# Patient Record
Sex: Female | Born: 1940 | Race: White | Hispanic: No | Smoking: Never smoker
Health system: Southern US, Community
[De-identification: ages and names within clinical notes are randomized; demographics above are authoritative.]

## PROBLEM LIST (undated history)

## (undated) DIAGNOSIS — R519 Headache, unspecified: Secondary | ICD-10-CM

## (undated) DIAGNOSIS — C801 Malignant (primary) neoplasm, unspecified: Secondary | ICD-10-CM

## (undated) DIAGNOSIS — R51 Headache: Secondary | ICD-10-CM

## (undated) DIAGNOSIS — E785 Hyperlipidemia, unspecified: Secondary | ICD-10-CM

## (undated) DIAGNOSIS — K219 Gastro-esophageal reflux disease without esophagitis: Secondary | ICD-10-CM

## (undated) DIAGNOSIS — I10 Essential (primary) hypertension: Secondary | ICD-10-CM

## (undated) DIAGNOSIS — J189 Pneumonia, unspecified organism: Secondary | ICD-10-CM

## (undated) DIAGNOSIS — M1990 Unspecified osteoarthritis, unspecified site: Secondary | ICD-10-CM

## (undated) DIAGNOSIS — H3530 Unspecified macular degeneration: Secondary | ICD-10-CM

## (undated) DIAGNOSIS — K760 Fatty (change of) liver, not elsewhere classified: Secondary | ICD-10-CM

## (undated) HISTORY — DX: Malignant (primary) neoplasm, unspecified: C80.1

## (undated) HISTORY — PX: APPENDECTOMY: SHX54

## (undated) HISTORY — PX: CHOLECYSTECTOMY: SHX55

## (undated) HISTORY — PX: CATARACT EXTRACTION W/ INTRAOCULAR LENS  IMPLANT, BILATERAL: SHX1307

## (undated) HISTORY — PX: BREAST SURGERY: SHX581

## (undated) HISTORY — DX: Essential (primary) hypertension: I10

## (undated) HISTORY — PX: COLON SURGERY: SHX602

## (undated) HISTORY — DX: Hyperlipidemia, unspecified: E78.5

---

## 1999-02-09 ENCOUNTER — Encounter: Admission: RE | Admit: 1999-02-09 | Discharge: 1999-02-09 | Payer: Self-pay | Admitting: Internal Medicine

## 1999-02-09 ENCOUNTER — Other Ambulatory Visit: Admission: RE | Admit: 1999-02-09 | Discharge: 1999-02-09 | Payer: Self-pay | Admitting: Internal Medicine

## 1999-02-09 ENCOUNTER — Encounter: Payer: Self-pay | Admitting: Internal Medicine

## 2000-02-18 ENCOUNTER — Other Ambulatory Visit: Admission: RE | Admit: 2000-02-18 | Discharge: 2000-02-18 | Payer: Self-pay | Admitting: Internal Medicine

## 2000-03-03 ENCOUNTER — Ambulatory Visit (HOSPITAL_COMMUNITY): Admission: RE | Admit: 2000-03-03 | Discharge: 2000-03-03 | Payer: Self-pay | Admitting: Internal Medicine

## 2000-03-03 ENCOUNTER — Encounter (INDEPENDENT_AMBULATORY_CARE_PROVIDER_SITE_OTHER): Payer: Self-pay | Admitting: *Deleted

## 2000-10-24 ENCOUNTER — Encounter: Payer: Self-pay | Admitting: Internal Medicine

## 2000-10-24 ENCOUNTER — Encounter: Admission: RE | Admit: 2000-10-24 | Discharge: 2000-10-24 | Payer: Self-pay | Admitting: Internal Medicine

## 2001-02-13 ENCOUNTER — Other Ambulatory Visit: Admission: RE | Admit: 2001-02-13 | Discharge: 2001-02-13 | Payer: Self-pay | Admitting: Internal Medicine

## 2001-05-29 ENCOUNTER — Encounter (INDEPENDENT_AMBULATORY_CARE_PROVIDER_SITE_OTHER): Payer: Self-pay | Admitting: *Deleted

## 2001-05-29 ENCOUNTER — Ambulatory Visit (HOSPITAL_COMMUNITY): Admission: RE | Admit: 2001-05-29 | Discharge: 2001-05-29 | Payer: Self-pay | Admitting: Gastroenterology

## 2001-07-19 ENCOUNTER — Encounter: Admission: RE | Admit: 2001-07-19 | Discharge: 2001-07-28 | Payer: Self-pay | Admitting: Surgery

## 2001-07-19 ENCOUNTER — Encounter: Payer: Self-pay | Admitting: Surgery

## 2001-07-24 ENCOUNTER — Inpatient Hospital Stay (HOSPITAL_COMMUNITY): Admission: RE | Admit: 2001-07-24 | Discharge: 2001-07-28 | Payer: Self-pay | Admitting: Surgery

## 2001-07-24 ENCOUNTER — Encounter (INDEPENDENT_AMBULATORY_CARE_PROVIDER_SITE_OTHER): Payer: Self-pay

## 2001-08-09 ENCOUNTER — Encounter: Payer: Self-pay | Admitting: Hematology and Oncology

## 2001-08-09 ENCOUNTER — Ambulatory Visit (HOSPITAL_COMMUNITY): Admission: RE | Admit: 2001-08-09 | Discharge: 2001-08-09 | Payer: Self-pay | Admitting: Hematology and Oncology

## 2002-08-20 ENCOUNTER — Ambulatory Visit (HOSPITAL_COMMUNITY): Admission: RE | Admit: 2002-08-20 | Discharge: 2002-08-20 | Payer: Self-pay | Admitting: Gastroenterology

## 2002-08-27 ENCOUNTER — Encounter: Payer: Self-pay | Admitting: Oncology

## 2002-08-27 ENCOUNTER — Ambulatory Visit (HOSPITAL_COMMUNITY): Admission: RE | Admit: 2002-08-27 | Discharge: 2002-08-27 | Payer: Self-pay | Admitting: Oncology

## 2002-09-18 ENCOUNTER — Encounter: Admission: RE | Admit: 2002-09-18 | Discharge: 2002-09-18 | Payer: Self-pay | Admitting: Oncology

## 2002-09-18 ENCOUNTER — Encounter: Payer: Self-pay | Admitting: Oncology

## 2003-09-24 ENCOUNTER — Encounter: Admission: RE | Admit: 2003-09-24 | Discharge: 2003-09-24 | Payer: Self-pay | Admitting: Internal Medicine

## 2004-12-30 ENCOUNTER — Ambulatory Visit (HOSPITAL_COMMUNITY): Admission: RE | Admit: 2004-12-30 | Discharge: 2004-12-30 | Payer: Self-pay | Admitting: Internal Medicine

## 2006-03-11 ENCOUNTER — Ambulatory Visit (HOSPITAL_COMMUNITY): Admission: RE | Admit: 2006-03-11 | Discharge: 2006-03-11 | Payer: Self-pay | Admitting: Internal Medicine

## 2006-06-05 ENCOUNTER — Encounter: Admission: RE | Admit: 2006-06-05 | Discharge: 2006-06-05 | Payer: Self-pay | Admitting: Internal Medicine

## 2007-03-22 ENCOUNTER — Ambulatory Visit (HOSPITAL_COMMUNITY): Admission: RE | Admit: 2007-03-22 | Discharge: 2007-03-22 | Payer: Self-pay | Admitting: Internal Medicine

## 2007-05-10 ENCOUNTER — Encounter: Admission: RE | Admit: 2007-05-10 | Discharge: 2007-05-10 | Payer: Self-pay | Admitting: Internal Medicine

## 2007-10-06 ENCOUNTER — Emergency Department (HOSPITAL_COMMUNITY): Admission: EM | Admit: 2007-10-06 | Discharge: 2007-10-06 | Payer: Self-pay | Admitting: Emergency Medicine

## 2007-10-10 ENCOUNTER — Encounter: Admission: RE | Admit: 2007-10-10 | Discharge: 2007-10-10 | Payer: Self-pay | Admitting: Internal Medicine

## 2007-10-12 ENCOUNTER — Encounter: Admission: RE | Admit: 2007-10-12 | Discharge: 2007-10-12 | Payer: Self-pay | Admitting: Internal Medicine

## 2008-03-25 ENCOUNTER — Emergency Department (HOSPITAL_COMMUNITY): Admission: EM | Admit: 2008-03-25 | Discharge: 2008-03-25 | Payer: Self-pay | Admitting: Family Medicine

## 2008-07-10 ENCOUNTER — Encounter: Admission: RE | Admit: 2008-07-10 | Discharge: 2008-07-10 | Payer: Self-pay | Admitting: Gastroenterology

## 2009-03-21 ENCOUNTER — Encounter: Admission: RE | Admit: 2009-03-21 | Discharge: 2009-03-21 | Payer: Self-pay | Admitting: Internal Medicine

## 2010-06-25 ENCOUNTER — Emergency Department (HOSPITAL_COMMUNITY)
Admission: EM | Admit: 2010-06-25 | Discharge: 2010-06-25 | Disposition: A | Discharge status: Home / Self Care | Payer: Medicare Other | Attending: Emergency Medicine | Admitting: Emergency Medicine

## 2010-06-25 DIAGNOSIS — E876 Hypokalemia: Secondary | ICD-10-CM | POA: Insufficient documentation

## 2010-06-25 DIAGNOSIS — E7800 Pure hypercholesterolemia, unspecified: Secondary | ICD-10-CM | POA: Insufficient documentation

## 2010-06-25 DIAGNOSIS — Z85038 Personal history of other malignant neoplasm of large intestine: Secondary | ICD-10-CM | POA: Insufficient documentation

## 2010-06-25 DIAGNOSIS — Z79899 Other long term (current) drug therapy: Secondary | ICD-10-CM | POA: Insufficient documentation

## 2010-06-25 DIAGNOSIS — R5381 Other malaise: Secondary | ICD-10-CM | POA: Insufficient documentation

## 2010-06-25 DIAGNOSIS — F411 Generalized anxiety disorder: Secondary | ICD-10-CM | POA: Insufficient documentation

## 2010-06-25 DIAGNOSIS — I10 Essential (primary) hypertension: Secondary | ICD-10-CM | POA: Insufficient documentation

## 2010-06-25 DIAGNOSIS — Z7982 Long term (current) use of aspirin: Secondary | ICD-10-CM | POA: Insufficient documentation

## 2010-06-25 DIAGNOSIS — R5383 Other fatigue: Secondary | ICD-10-CM | POA: Insufficient documentation

## 2010-06-25 LAB — POCT I-STAT, CHEM 8
BUN: 10 mg/dL (ref 6–23)
Creatinine, Ser: 0.8 mg/dL (ref 0.4–1.2)
Potassium: 3.2 mEq/L — ABNORMAL LOW (ref 3.5–5.1)
Sodium: 141 mEq/L (ref 135–145)

## 2010-08-21 NOTE — Consult Note (Signed)
[facility]  Patient:    Donna Buck, Donna Buck Visit Number: [id number] MRN: [id number]          Service Type: SUR Location: 4W 0451 01 Attending Physician:  Bonnetta Barry Dictated by:   Lowell C. Catha Gosselin, M.D. Proc. Date: 07/27/01 Admit Date:  07/24/2001   CC:         Petra Kuba, M.D.  Velora Heckler, M.D.  Erskine Speed, M.D.   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  Primary size was 1.8 cm in size without perforation.  There was invasion into the submucosa, again into the lymphatic system.  She has a T1, M1 lesion Dukes C2.  Path report [id number].  She had undergone endoscopic polypectomy in November 2001 that had shown a tubulovillous adenoma with high-grade dysplasia.  Colonoscopy February 2003 had shown a recurrent polyp which was positive for adenocarcinoma, and then resection was following.  PAST MEDICAL HISTORY: 1. Hypertension. 2. Hypercholesterolemia. 3. GERD. 4. Cholecystectomy and appendectomy.  ALLERGIES:  PENICILLIN.  MEDICATIONS: 1. Atenolol 100 mg p.o. q.d. 2. Lipitor 10 mg p.o. q.d.  FAMILY HISTORY:  Positive for hypertension, hypothyroidism, and colon cancer.  SOCIAL HISTORY:  Divorced x 12 years.  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  Lives in [place].  REVIEW OF SYSTEMS:  Positive for headache, no weight loss, no weakness, no fatigue, some indigestion, some blood in her stool.  PHYSICAL EXAMINATION:  VITAL SIGNS:  Temperature 100.8 degrees, pulse 67, respirations 20, blood pressure 152/73.  HEENT:  Normocephalic, atraumatic.  Pupils, equal, round, regular and reactive to light.  Extraocular muscles are intact.  Sclerae  anicteric.  NODES:  Negative.  CHEST:  Clear.  ABDOMEN:  No hepatosplenomegaly, healing surgical wound.  EXTREMITIES:  No calf tenderness.  No clubbing, cyanosis, or edema.  NEUROLOGIC:  She is oriented x 3.  Cranial nerves 2-12 intact.  LABORATORY STUDIES:  CEA baseline at 1.1, LDH 144.  Hemoglobin 13.3, white count 10.2, platelet count 192,000.  Sodium 138, potassium 3.6, CO2 27, glucose 136, BUN 5, creatinine .8, calcium 8.8, albumin 4.7, total protein 7.9, alkaline phosphatase 82.  ASSESSMENT:  Colon carcinoma, Dukes C2.  PLAN:  We would recommend adjuvant 5-FU leucovorin chemotherapy x 26 weeks. We would start in 3-4 weeks, see her, and do her staging studies just prior to that.  ______ standard of care, certainly not experimental and would be able to provide her with her best risk reduction for recurrent cancer.  They will think this over and certainly will talk about it more at her next visit. Thank you for allowing [place] to share in her care. Dictated by:   Lowell C. Catha Gosselin, M.D. Attending Physician:  Bonnetta Barry DD:  07/27/01 TD:  07/28/01 Job: [id number]

## 2010-08-21 NOTE — Op Note (Signed)
[facility]  Patient:    Donna Buck, Donna Buck Visit Number: [id number] MRN: [id number]          Service Type: SUR Location: 4W 0451 01 Attending Physician:  Bonnetta Barry Dictated by:   Velora Heckler, M.D. Proc. Date: 07/24/01 Admit Date:  07/24/2001   CC:         Petra Kuba, M.D.  Erskine Speed, M.D.   Operative Report  PREOPERATIVE DIAGNOSIS:  Adenocarcinoma, right colon.  POSTOPERATIVE DIAGNOSIS:  Adenocarcinoma, right colon.  PROCEDURE:  Right colectomy.  SURGEON:  Velora Heckler, M.D.  ASSISTANT:  Sandria Bales. Ezzard Standing, M.D.  ANESTHESIA:  General per Dr. Rica Mast.  ESTIMATED BLOOD LOSS:  Less than 100 cc.  PREPARATION:  Betadine.  COMPLICATIONS:  None.  INDICATIONS:  The patient is a 70 year old white female who presents at the request of Dr. Leary Roca with a malignant polyp in the right colon. The patient had undergone endoscopic polypectomy in November 2001. Final pathology showed tubulovillous adenoma without high grade dysplasia. Followup colonoscope in February 2003, however, demonstrated a recurrent polyp. This was excised endoscopically. The entire polyp was composed of invasive adenocarcinoma with a broad base measuring approximately 1.1 cm in size. The tumor extended to the base. The patient now comes to surgery for right colectomy for resection of adenocarcinoma.  DESCRIPTION OF PROCEDURE:  The procedure is done in OR#[facility]. The patient is brought to the operating room, placed in a supine position on the operating room table. Following administration of general anesthesia, the patient is prepped and draped in the usual strict aseptic fashion. After ascertaining that an adequate level of anesthesia had been obtained, a right transverse abdominal incision is made with #10 blade. Dissection is carried down through the subcutaneous tissues. Muscle layers are divided using the electrocautery  for hemostasis. Peritoneal cavity is entered cautiously. Adhesions of the omentum to the anterior abdominal wall are taken down with the electrocautery. Mobilization was carried up to the liver. A Balfour retractor is placed for exposure. The cecum is mobilized. Peritoneal attachments are incised. Terminal ileum is mobilized. Scar tissue from previous appendectomy is mobilized and the electrocautery used for hemostasis. Dissection is carried up the right colic gutter along the white line. The hepatic flexure is mobilized. Adhesions to the gallbladder bed are taken down using the electrocautery. Vessels are divided between [facility] clamps and ligated with 2-0 silk ties. Care is taken to preserve the duodenum. Colon is mobilized to the mid transverse colon. Omentum is split down to the mid transverse colon between [facility] clamps, ligated with 2-0 silk ties. Next, the transverse colon is transected at its proximal third-middle third junction using a 75 mm GIA stapler. Also, the terminal ileum is transected just before the cocks comb using the 75 mm GIA stapler. Mesentery is taken down between [facility] clamps and divided and ligated with 2-0 silk ties and 2-0 silk suture ligatures. Dissection is carried along the mesentery to the terminal ileum. The entire specimen is excised and passed off the field to the back table. It was later opened on the back table and reveals approximately 1.5 cm sessile mass just distal to the ileocecal valve, which likely represents adenocarcinoma. This is marked with a suture and submitted fresh to pathology for review.  Next, the anastomosis is performed. This a side-to-side functional end-to-end anastomosis between the terminal ileum and the middle third of the transverse colon. Bowels are lined with interrupted 3-0 silk  sutures. Enterotomies are made. GIA stapler is inserted, reassembled and fired. Staple line is inspected for hemostasis. Enterotomy is closed using a TA  60 stapler. There is a small piece of redundant small bowel. Mesentery to the this redundant piece of small bowel distal to the anastomosis is taken down between Kelly clamps and divided. These are ligated with 2-0 silk ties. The bowel is then transected again using the TA 60 stapling device. The anastomosis appears widely patent easily admitting two fingertips. Good hemostasis is noted. Mesenteric defect is closed with interrupted 2-0 silk sutures. The abdomen is copiously irrigated with warm saline, which is evacuated. Omentum is used to cover the anastomosis. Abdominal wall is then closed in two layers with running #1 PDS suture. Subcutaneous tissue is irrigated copiously with warm saline and hemostasis obtained with the electrocautery. The skin is closed with stainless steel staples. Sterile dressings are applied. The patient is awakened from anesthesia and brought to the recovery room in stable condition. The patient tolerated the procedure well. Dictated by:   Velora Heckler, M.D. Attending Physician:  Bonnetta Barry DD:  07/24/01 TD:  07/24/01 Job: [id number]

## 2010-08-21 NOTE — Op Note (Signed)
[facility]  Patient:    Donna Buck, MCELHINNEY Visit Number: [id number] MRN: [id number]          Service Type: END Location: ENDO Attending Physician:  Nelda Marseille Dictated by:   Petra Kuba, M.D. Proc. Date: 05/29/01 Admit Date:  05/29/2001   CC:         Erskine Speed, M.D.   Operative Report  PROCEDURE:  Colonoscopy with polypectomy.  ENDOSCOPIST:  Petra Kuba, M.D.  INDICATIONS:  This patient with a history of a tubular villus adenoma, unsure if completely removed.  Due for a repeat screening.  A consent was signed after the risks, benefits, methods, and options were thoroughly discussed in the office in the past.  MEDICINES USED:  Demerol 100 mg, Versed 10 mg.  DESCRIPTION OF PROCEDURE:  The rectal inspection is pertinent for external hemorrhoids.  A digital examination was negative.  The video pediatric adjustable colonoscope was inserted and easily advanced around the colon to the ascending colon.  The probable same polyp one fold above the ileocecal valve was seen.  Photo documentation was obtained.  It was semi-sessile and slightly worrisome.  There was no frank ulceration.  Advanced to the cecal pole did require first rolling her on her back and then on her right side with abdominal pressure.  We were able to advance to the cecal pole which was identified by the appendiceal orifice and the ileocecal valve.  The scope was slowly withdrawn.  The prep was adequate.  There was some liquid stool that required washing and suctioning.  We went ahead and rolled her back on her left side, to proceed with the polypectomy.  The snare was first marked, so we knew how much of the wire was within the mucosa.  On the first snare attempt we shaved part of the top of the polyp off, and this piece was suctioned through the scope and collected in the trap.  There was no cautery on this first piece.  We then resnared the polyp.  Electrocautery  was applied, and the polyp was removed.  There was a nice white coagulum without any signs of bleeding, and no obvious residual polyp, although with some of the cautery edema, it was difficult to say for sure.  We went ahead and suctioned the polyp onto the head of the scope, and the scope was withdrawn and the polyp recovered and put in the first container.  The scope was then reinserted and easily advanced to the polypectomy site which again had no signs of active bleeding or obvious residual polyp.  The scope was slowly withdrawn at that point.  Other than a tiny rectal probably hypoplastic-appearing polyp which was hot-biopsied, no other abnormalities were seen on slow withdrawal.  Once back in the rectum, the scope was retroflexed, pertinent for some internal small hemorrhoids.  The scope was straightened.  Air was suctioned and the scope removed.  The patient tolerated the procedure well.  There was no obvious immediate complication.  ENDOSCOPIC DIAGNOSES 1. Internal and external hemorrhoids. 2. Tiny rectal polyp hot-biopsied, probably hypoplastic, put in one    container. 3. Probable same residual polyp in the ascending, status post snare x 2,    and put in the first container. 4. Otherwise within normal limits to the cecum.  PLAN:  Await pathology.  If worrisome cells, might need to consider surgical options, unless pathology can unequivocally say that all polyp is removed. Depending upon benign pathology,  may want to repeat the colonoscopy even sooner, to confirm complete removal.  Otherwise will put her on a 10-day customary no aspirin or nonsteroidals, and a seven-day low-residue diet restrictions. Dictated by:   Petra Kuba, M.D. Attending Physician:  Nelda Marseille DD:  05/29/01 TD:  05/29/01 Job: [id number]

## 2010-08-21 NOTE — Discharge Summary (Signed)
[facility]  Patient:    Donna Buck, Donna Buck Visit Number: [id number] MRN: [id number]          Service Type: SUR Location: 4W 0451 01 Attending Physician:  Bonnetta Barry Dictated by:   Velora Heckler, M.D. Admit Date:  07/24/2001 Discharge Date: 07/28/2001   CC:         Lowell C. Catha Gosselin, M.D.  Petra Kuba, M.D.  Erskine Speed, M.D.  [facility]   Discharge Summary  REASON FOR CONSULTATION:  Adenocarcinoma of the colon.  BRIEF HISTORY:  The patient is a 70 year old white female who presents at the request of Dr. Vida Rigger for malignant polyp of the right colon.  The patient had had a previous tubular villous adenoma excised in November 2001.  Followup colonoscopy in February 2003 demonstrated a recurrent polyp.  This was excised endoscopically.  Final pathology showed invasive adenocarcinoma extending to a broad base.  The patient was now admitted for resection of the right colon for presumed adenocarcinoma.  HOSPITAL COURSE:  The patient was admitted and taken directly to the operating room on July 24, 2001.  She underwent right colectomy.  Her postoperative course was relatively straightforward.  The patient participated in a Phase III clinical trial from [employer] with a postoperative ileus drug.  The patient started on a clear liquid diet on her first postoperative day.  She advanced to a regular diet which was relatively poorly tolerated on the second postoperative day.  However, by the third postoperative day, the patient was tolerating a regular diet and had return of GI function with bowel movement.  The patients intake continued to increase, and her pain was well controlled.  She was prepared for discharge on the fourth postoperative day.  DISCHARGE PLANNING:  The patient is discharged home on July 28, 2001, in good condition, tolerating a regular diet, and ambulating independently.  The patient will be  seen back in my office at [facility] next week for a wound check and suture removal.  The patient was seen in consultation during this admission by Dr. Lyndal Pulley from the [facility]  She will see Dr. Catha Gosselin back in his office in three weeks to consider initiation of chemotherapy.  FINAL PATHOLOGY:  Poorly differentiated colonic adenocarcinoma with invasion of the submucosa and metastasis to 2 of 13 lymph nodes, making it a T1, N1, MX tumor.  DISCHARGE MEDICATIONS:  Vicodin, Bactrim, and Magic Mouthwash as well as regular home medications.  CONDITION UPON DISCHARGE:  Good. Dictated by:   Velora Heckler, M.D. Attending Physician:  Bonnetta Barry DD:  07/28/01 TD:  07/29/01 Job: [id number]

## 2010-08-21 NOTE — Op Note (Signed)
   NAME:  Donna Buck, Donna Buck                            ACCOUNT NO.:  [id number]   MEDICAL RECORD NO.:  [id number]                   PATIENT TYPE:  AMB   LOCATION:  ENDO                                 FACILITY:  [facility]   PHYSICIAN:  John C. Madilyn Fireman, M.D.                 DATE OF BIRTH:  [birth year]   DATE OF PROCEDURE:  08/20/2002  DATE OF DISCHARGE:                                 OPERATIVE REPORT   PROCEDURE:  Colonoscopy.   INDICATION FOR PROCEDURE:  A history of right hemicolectomy for colon cancer  one year ago.  The procedure is for surveillance based on her colon cancer.   DESCRIPTION OF PROCEDURE:  The patient was placed in the left lateral  decubitus position and placed on the pulse monitor with continuous low-flow  oxygen delivered by nasal cannula.  She was sedated with 75 mcg of IV  Demerol and 7 mg IV Versed.  The Olympus video colonoscope was inserted into  the rectum and advanced to the ileocolonic anastomosis, which was clearly  identifiable.  The ileum was examined for several centimeters and appeared  normal.  There was no suspicion of anastomotic recurrence or any mass or  polyp seen.  The blind termination of the proximal colon appeared normal, as  did the remaining transverse, descending, sigmoid, and rectum all the way to  the anus with no polyps, masses, diverticula, or other mucosal  abnormalities.  There were some small internal hemorrhoids seen on  withdrawal.  The scope was then withdrawn and the patient returned to the  recovery room in stable condition.  She tolerated the procedure well, and  there were no immediate complications.   IMPRESSION:  Normal colonoscopy status post right hemicolectomy.   PLAN:  Next surveillance colonoscopy in three years.                                               John C. Madilyn Fireman, M.D.    [facility]  D:  08/20/2002  T:  08/20/2002  Job:  [id number]   cc:   Erskine Speed, M.D.  [address]  [place]  [place] [address]  Fax:  [phone]   Petra Kuba, M.D.  [address]  [place]  [place] [address]  Fax: [phone]   Velora Heckler, M.D.  [address] [place]  [place] [address]  Fax: [phone]

## 2010-12-31 LAB — CBC
HCT: 43.6
Platelets: 207
RDW: 12.9
WBC: 5.4

## 2010-12-31 LAB — POCT I-STAT, CHEM 8
BUN: 8
Chloride: 107
Creatinine, Ser: 0.9
Hemoglobin: 15
Potassium: 3.5

## 2010-12-31 LAB — POCT CARDIAC MARKERS: Myoglobin, poc: 57.6

## 2010-12-31 LAB — DIFFERENTIAL
Eosinophils Absolute: 0
Eosinophils Relative: 1
Monocytes Absolute: 0.3
Neutro Abs: 3.4
Neutrophils Relative %: 63

## 2011-03-16 ENCOUNTER — Other Ambulatory Visit: Payer: Self-pay | Admitting: Internal Medicine

## 2011-03-16 DIAGNOSIS — Z780 Asymptomatic menopausal state: Secondary | ICD-10-CM

## 2011-03-16 DIAGNOSIS — Z1231 Encounter for screening mammogram for malignant neoplasm of breast: Secondary | ICD-10-CM

## 2011-03-19 ENCOUNTER — Ambulatory Visit
Admission: RE | Admit: 2011-03-19 | Discharge: 2011-03-19 | Disposition: A | Discharge status: Home / Self Care | Payer: BC Managed Care – PPO | Source: Ambulatory Visit | Attending: Internal Medicine | Admitting: Internal Medicine

## 2011-03-19 DIAGNOSIS — Z780 Asymptomatic menopausal state: Secondary | ICD-10-CM

## 2011-03-19 DIAGNOSIS — Z1231 Encounter for screening mammogram for malignant neoplasm of breast: Secondary | ICD-10-CM

## 2011-09-23 ENCOUNTER — Other Ambulatory Visit: Payer: Self-pay | Admitting: Gastroenterology

## 2012-07-20 ENCOUNTER — Other Ambulatory Visit (HOSPITAL_COMMUNITY): Payer: Self-pay | Admitting: Internal Medicine

## 2012-07-20 ENCOUNTER — Inpatient Hospital Stay (HOSPITAL_COMMUNITY)
Admission: RE | Admit: 2012-07-20 | Discharge: 2012-07-20 | Disposition: A | Discharge status: Home / Self Care | Payer: Medicare Other | Source: Ambulatory Visit

## 2012-07-20 DIAGNOSIS — R079 Chest pain, unspecified: Secondary | ICD-10-CM

## 2012-07-21 ENCOUNTER — Ambulatory Visit (HOSPITAL_COMMUNITY)
Admission: RE | Admit: 2012-07-21 | Discharge: 2012-07-21 | Disposition: A | Discharge status: Home / Self Care | Payer: Medicare Other | Source: Ambulatory Visit | Attending: Internal Medicine | Admitting: Internal Medicine

## 2012-07-21 DIAGNOSIS — R079 Chest pain, unspecified: Secondary | ICD-10-CM | POA: Insufficient documentation

## 2013-02-21 ENCOUNTER — Other Ambulatory Visit: Payer: Self-pay

## 2013-02-21 DIAGNOSIS — Z1231 Encounter for screening mammogram for malignant neoplasm of breast: Secondary | ICD-10-CM

## 2013-02-22 ENCOUNTER — Ambulatory Visit (HOSPITAL_COMMUNITY)
Admission: RE | Admit: 2013-02-22 | Discharge: 2013-02-22 | Disposition: A | Discharge status: Home / Self Care | Payer: Medicare Other | Source: Ambulatory Visit | Attending: Internal Medicine | Admitting: Internal Medicine

## 2013-02-22 DIAGNOSIS — Z1231 Encounter for screening mammogram for malignant neoplasm of breast: Secondary | ICD-10-CM | POA: Insufficient documentation

## 2013-09-21 ENCOUNTER — Other Ambulatory Visit: Payer: Self-pay | Admitting: Internal Medicine

## 2013-09-21 DIAGNOSIS — Z780 Asymptomatic menopausal state: Secondary | ICD-10-CM

## 2013-09-21 DIAGNOSIS — M8580 Other specified disorders of bone density and structure, unspecified site: Secondary | ICD-10-CM

## 2013-09-27 ENCOUNTER — Ambulatory Visit
Admission: RE | Admit: 2013-09-27 | Discharge: 2013-09-27 | Disposition: A | Discharge status: Home / Self Care | Payer: Medicare Other | Source: Ambulatory Visit | Attending: Internal Medicine | Admitting: Internal Medicine

## 2013-09-27 DIAGNOSIS — M8580 Other specified disorders of bone density and structure, unspecified site: Secondary | ICD-10-CM

## 2013-09-28 ENCOUNTER — Other Ambulatory Visit: Payer: Medicare Other

## 2013-11-15 ENCOUNTER — Encounter (HOSPITAL_COMMUNITY): Payer: Self-pay

## 2014-06-14 ENCOUNTER — Ambulatory Visit (INDEPENDENT_AMBULATORY_CARE_PROVIDER_SITE_OTHER): Payer: Medicare Other | Admitting: Cardiovascular Disease

## 2014-06-14 ENCOUNTER — Encounter: Payer: Self-pay | Admitting: Cardiovascular Disease

## 2014-06-14 VITALS — BP 150/89 | HR 70 | Ht 68.0 in | Wt 180.8 lb

## 2014-06-14 DIAGNOSIS — R079 Chest pain, unspecified: Secondary | ICD-10-CM

## 2014-06-14 DIAGNOSIS — I10 Essential (primary) hypertension: Secondary | ICD-10-CM

## 2014-06-14 MED ORDER — VALSARTAN 160 MG PO TABS: 160.0000 mg | ORAL_TABLET | Freq: Every day | ORAL | Status: DC

## 2014-06-14 NOTE — Patient Instructions (Signed)
Your physician has recommended you make the following change in your medication:  1) STOP Losartan 2) START Valsartan 160 mg daily  Your physician has requested that you have an echocardiogram. Echocardiography is a painless test that uses sound waves to create images of your heart. It provides your doctor with information about the size and shape of your heart and how well your heart's chambers and valves are working. This procedure takes approximately one hour. There are no restrictions for this procedure.  Your physician has requested that you have a lexiscan myoview. For further information please visit [email or web address]. Please follow instruction sheet, as given.  Your physician recommends that you schedule a follow-up appointment in: 3 months with Dr. Acie Fredrickson.

## 2014-06-14 NOTE — Progress Notes (Signed)
Cardiology Office Note   Date:  06/14/2014   ID:  Donna, Buck March 01, 1941, MRN [id number]  PCP:  Criselda Peaches, MD  Cardiologist:   Thayer Headings, MD   Chief Complaint  Patient presents with  . Hypertension    Problem list: 1. Hypertension 2. Hyperlipidemia   June 14, 2014: Donna Buck is a 74 y.o. female who presents for  Further evaluation of her HTN She has had intermittent  HTN.. Eats fast foods once a week.   Eats canned foods once a week.   Does not get any exercise.  Bowls for exercise.  Walks her sister's dog 30 minutes some day.   LAbs from Dr. Vonna Kotyk office   total cholesterol was 230  triglyceride levels 183 HDL equals 53 LDL cholesterol 140  24 hour urine for Pheo was normal in 2013.  She has not been able to tolerate diuretics due to leg cramping.   She was on one BP pill that caused her to cough ( ACE - inhibitor)  Non smoker, no ETOH.  She has episodes of CP at times, the pain will radiate out to her arms and through to her back . Worse when BP is high and worse when she walks   Past Medical History  Diagnosis Date  . Hypertension   . Hyperlipidemia   . Cancer     colon    Past Surgical History  Procedure Laterality Date  . Colon surgery    . Cholecystectomy    . Appendectomy       Current Outpatient Prescriptions  Medication Sig Dispense Refill  . ALPRAZolam (XANAX XR) 0.5 MG 24 hr tablet Take 0.5 mg by mouth daily.    . carvedilol (COREG) 25 MG tablet Take 25 mg by mouth 2 (two) times daily with a meal.    . cloNIDine (CATAPRES) 0.1 MG tablet Take 0.1 mg by mouth 2 (two) times daily.    . valsartan (DIOVAN) 160 MG tablet Take 1 tablet (160 mg total) by mouth daily. 30 tablet 3   No current facility-administered medications for this visit.    Allergies:   Penicillins    Social History:  The patient  reports that she has never smoked. She does not have any smokeless tobacco history on file.   Family History:  The  patient's family history includes Diabetes in her maternal aunt; Heart attack in her father; Heart failure in her brother and sister; Hypertension in her mother.    ROS:  Please see the history of present illness.    Review of Systems: Constitutional:  denies fever, chills, diaphoresis, appetite change and fatigue.  HEENT: denies photophobia, eye pain, redness, hearing loss, ear pain, congestion, sore throat, rhinorrhea, sneezing, neck pain, neck stiffness and tinnitus.  Respiratory: admits to SOB, DOE, cough, chest tightness, and wheezing.  Cardiovascular: admits to chest pain, palpitations and leg swelling.  Gastrointestinal: denies nausea, vomiting, abdominal pain, diarrhea, constipation, blood in stool.  Genitourinary: denies dysuria, urgency, frequency, hematuria, flank pain and difficulty urinating.  Musculoskeletal: denies  myalgias, back pain, joint swelling, arthralgias and gait problem.   Skin: denies pallor, rash and wound.  Neurological: denies dizziness, seizures, syncope, weakness, light-headedness, numbness and headaches.   Hematological: denies adenopathy, easy bruising, personal or family bleeding history.  Psychiatric/ Behavioral: denies suicidal ideation, mood changes, confusion, nervousness, sleep disturbance and agitation.       All other systems are reviewed and negative.    PHYSICAL EXAM: VS:  BP 150/89 mmHg  Pulse 70  Ht 5\' 8"  (1.727 m)  Wt 180 lb 12.8 oz (82.01 kg)  BMI 27.50 kg/m2 , BMI Body mass index is 27.5 kg/(m^2). GEN: Well nourished, well developed, in no acute distress, appeared anxious at times.  HEENT: normal Neck: no JVD, carotid bruits, or masses Cardiac: RRR; no murmurs, rubs, or gallops,no edema  Respiratory:  clear to auscultation bilaterally, normal work of breathing GI: soft, nontender, nondistended, + BS MS: no deformity or atrophy Skin: warm and dry, no rash Neuro:  Strength and sensation are intact Psych: normal   EKG:  EKG is  ordered today. The ekg ordered today demonstrates NSR at 70.  Small Q waves in the lateral leads - not too different from her ECG during her GXT in 2014.    Recent Labs: No results found for requested labs within last 365 days.    Lipid Panel No results found for: CHOL, TRIG, HDL, CHOLHDL, VLDL, LDLCALC, LDLDIRECT    Wt Readings from Last 3 Encounters:  06/14/14 180 lb 12.8 oz (82.01 kg)      Other studies Reviewed: Additional studies/ records that were reviewed today include: review of labs from primary medical doctor . Review of the above records demonstrates: mildly elevated cholesterol, negative 24 hr urine    ASSESSMENT AND PLAN:  1.   Essential hypertension-  Her blood pressure remains mildly elevated. She's been intolerant to diuretics. It also sounds that she's tried an ACE inhibitor but developed a cough.   She has previously had a 24-hour urine for catecholamines, metanephrines, VMAs  and pheochromocytoma has been ruled out.   we will stop the losartan and try her on valsartan 160 mg a day. Given her lots of advice regarding low-salt diet and I've advised her to exercise on a regular basis. She still eats a fair amount of salty foods. We will  get an echocardiogram to assess her LV function.  2. Chest discomfort -  She mentioned having some chest discomfort. These episodes typically happen when she has not elevated blood pressure. It's possible that she has some critical coronary artery disease that's contributing to her episodes of hypertension. We'll schedule her for a [employer] study.   3. Anxiety: The patient clearly has anxiety. She was anxious about every decision that we may today. In fact she was so anxious at times that she was not able to decide which drugstore she  wanted her medications sent to.  I suspect that her anxiety  Is playing a role in her symptoms.   Current medicines are reviewed at length with the patient today.  The patient does not have  concerns regarding medicines.  The following changes have been made:  no change  Labs/ tests ordered today include:   Orders Placed This Encounter  Procedures  . Myocardial Perfusion Imaging  . EKG 12-Lead  . 2D Echocardiogram with contrast     Disposition:   FU with me in  3 months    Signed, Devine Klingel, Wonda Cheng, MD  06/14/2014 5:26 PM    [address], [place], [place]  [address] Phone: [phone]; Fax: [phone]

## 2014-06-19 ENCOUNTER — Telehealth: Payer: Self-pay | Admitting: Cardiovascular Disease

## 2014-06-20 NOTE — Telephone Encounter (Signed)
Spoke with patient regarding cancellation of stress test appointment.  Patient states she cannot afford the test right now but will keep appointment for her echo.  I advised patient that I will call her next week with the results of the echocardiogram.  Patient verbalized understanding and agreement.

## 2014-06-24 ENCOUNTER — Encounter (HOSPITAL_COMMUNITY): Payer: Medicare Other

## 2014-06-24 ENCOUNTER — Ambulatory Visit (HOSPITAL_COMMUNITY): Payer: Medicare Other | Attending: Cardiology | Admitting: Radiology

## 2014-06-24 DIAGNOSIS — I10 Essential (primary) hypertension: Secondary | ICD-10-CM | POA: Diagnosis not present

## 2014-06-24 DIAGNOSIS — R079 Chest pain, unspecified: Secondary | ICD-10-CM

## 2014-06-24 NOTE — Progress Notes (Signed)
Echocardiogram performed.  

## 2014-08-22 ENCOUNTER — Telehealth: Payer: Self-pay | Admitting: Cardiovascular Disease

## 2014-08-22 NOTE — Telephone Encounter (Signed)
New message  Pt is having chest pain and would like to discuss is with Dr. Acie Fredrickson. Please call

## 2014-08-22 NOTE — Treatment Plan (Signed)
This is a telephone note on Donna Buck. I spoke with her primary medical doctor-Dr. Zada Girt. Mrs. Hurrell has had 2 weeks of constant chest pain. Dr. Nyoka Cowden saw her in the office today and her EKG was unremarkable.   She has refused the Myoview stress test but wanted to have a cardiac cath . I've reviewed her notes with Dr. Nyoka Cowden and he agrees that a stress nuclear study is the appropriate test. She will need to make an appointment to see [place] in the office or perhaps see one of the physician extenders.     Kimbly Eanes, Wonda Cheng, MD  08/22/2014 5:41 PM    [facility] [place],  [place], [place]  [address] Pager [phone] Phone: [phone]; Fax: [phone]   [facility]  [address] [place], [place]  [address] [phone]    Fax [phone]

## 2014-08-22 NOTE — Telephone Encounter (Signed)
Will one of the triage nurses please call and get some details of the patient's chest pain

## 2014-08-23 ENCOUNTER — Telehealth: Payer: Self-pay | Admitting: Nurse Practitioner

## 2014-08-23 NOTE — Telephone Encounter (Signed)
Agree with note by Michelle Swinyer, RN  

## 2014-08-23 NOTE — Telephone Encounter (Signed)
Spoke with patient who states she is feeling weak this morning due to stomach pain under right ribs and diarrhea.  Patient states Dr. Nyoka Cowden was concerned because of patient's elevated BP.  She reports BP is 165/88.  I advised that symptoms do not sound cardiac in nature and asked if Dr. Nyoka Cowden, her PCP whom she saw yesterday, gave her any additional advice or ordered any tests.  She states she just wants to make certain she does not have blockages in her heart and I advised that Dr. Acie Fredrickson recommended a lexiscan myoview when she was seen in March but she refused.  Patient states she does not feel that she can undergo that test.  When asked why, she states she has had family members who did not tolerate the test well and said they would never do it again; she has never had a nuclear imaging study herself.  I advised patient that this would be the next step in her treatment plan to evaluate whether or not there is a blockage and that insurance will likely not pay for a heart catheterization without this evaluative test being performed first.  I encouraged her to follow-up with PCP and/or GI doctor for symptoms and to call back if she decides she would like to have the nuclear imaging study.  Patient is aware of appointment with Dr. Acie Fredrickson 6/2.  Patient verbalized understanding and agreement with plan of care.

## 2014-08-23 NOTE — Telephone Encounter (Signed)
Spoke with Dr. Nyoka Cowden, he stated he had spoken with Dr. Acie Fredrickson last night and did not need any additional information.

## 2014-08-26 ENCOUNTER — Other Ambulatory Visit: Payer: Self-pay

## 2014-08-26 DIAGNOSIS — Z1231 Encounter for screening mammogram for malignant neoplasm of breast: Secondary | ICD-10-CM

## 2014-09-05 ENCOUNTER — Encounter: Payer: Self-pay | Admitting: Cardiovascular Disease

## 2014-09-05 ENCOUNTER — Ambulatory Visit (INDEPENDENT_AMBULATORY_CARE_PROVIDER_SITE_OTHER): Payer: Medicare Other | Admitting: Cardiovascular Disease

## 2014-09-05 VITALS — BP 122/98 | HR 75 | Ht 68.0 in | Wt 179.2 lb

## 2014-09-05 DIAGNOSIS — R079 Chest pain, unspecified: Secondary | ICD-10-CM | POA: Insufficient documentation

## 2014-09-05 DIAGNOSIS — R0789 Other chest pain: Secondary | ICD-10-CM | POA: Diagnosis not present

## 2014-09-05 DIAGNOSIS — I10 Essential (primary) hypertension: Secondary | ICD-10-CM | POA: Diagnosis not present

## 2014-09-05 NOTE — Patient Instructions (Signed)
Medication Instructions:  Your physician recommends that you continue on your current medications as directed. Please refer to the Current Medication list given to you today.   Labwork: None Ordered  Testing/Procedures: Your physician has requested that you have a lexiscan myoview. For further information please visit [email or web address]. Please follow instruction sheet, as given.    Follow-Up: Your physician recommends that you schedule a follow-up appointment in: as needed with Dr. Acie Fredrickson

## 2014-09-05 NOTE — Progress Notes (Signed)
Cardiology Office Note   Date:  09/05/2014   ID:  Donna, Buck [birth year], MRN [id number]  PCP:  Criselda Peaches, MD  Cardiologist:   Thayer Headings, MD   Chief Complaint  Patient presents with  . Chest Pain    Problem list: 1. Hypertension 2. Hyperlipidemia   June 14, 2014: Donna Buck is a 74 y.o. female who presents for  Further evaluation of her HTN She has had intermittent  HTN.. Eats fast foods once a week.   Eats canned foods once a week.   Does not get any exercise.  Bowls for exercise.  Walks her sister's dog 30 minutes some day.   LAbs from Dr. Vonna Kotyk office   total cholesterol was 230  triglyceride levels 183 HDL equals 53 LDL cholesterol 140  24 hour urine for Pheo was normal in 2013.  She has not been able to tolerate diuretics due to leg cramping.   She was on one BP pill that caused her to cough ( ACE - inhibitor)  Non smoker, no ETOH.  She has episodes of CP at times, the pain will radiate out to her arms and through to her back . Worse when BP is high and worse when she walks   September 05, 2014:    she did not want to her the myoview study. She wanted to have a cath  - we explained that this was not the appropriate next step . Her BP is quite variable.  Seems to be troubled by that  needs to exercise   Past Medical History  Diagnosis Date  . Hypertension   . Hyperlipidemia   . Cancer     colon    Past Surgical History  Procedure Laterality Date  . Colon surgery    . Cholecystectomy    . Appendectomy       Current Outpatient Prescriptions  Medication Sig Dispense Refill  . ALPRAZolam (XANAX XR) 0.5 MG 24 hr tablet Take 0.5 mg by mouth daily.    Marland Kitchen aspirin 81 MG tablet Take 81 mg by mouth daily.    . carvedilol (COREG) 25 MG tablet Take 25 mg by mouth 2 (two) times daily with a meal.    . cloNIDine (CATAPRES) 0.1 MG tablet Take 0.1 mg by mouth 2 (two) times daily.    . valsartan (DIOVAN) 160 MG tablet Take 1 tablet (160 mg  total) by mouth daily. 30 tablet 3   No current facility-administered medications for this visit.    Allergies:   Penicillins    Social History:  The patient  reports that she has never smoked. She does not have any smokeless tobacco history on file.   Family History:  The patient's family history includes Diabetes in her maternal aunt; Heart attack in her father; Heart failure in her brother and sister; Hypertension in her mother.    ROS:  Please see the history of present illness.    Review of Systems: Constitutional:  denies fever, chills, diaphoresis, appetite change and fatigue.  HEENT: denies photophobia, eye pain, redness, hearing loss, ear pain, congestion, sore throat, rhinorrhea, sneezing, neck pain, neck stiffness and tinnitus.  Respiratory: admits to SOB, DOE, cough, chest tightness, and wheezing.  Cardiovascular: admits to chest pain, palpitations and leg swelling.  Gastrointestinal: denies nausea, vomiting, abdominal pain, diarrhea, constipation, blood in stool.  Genitourinary: denies dysuria, urgency, frequency, hematuria, flank pain and difficulty urinating.  Musculoskeletal: denies  myalgias, back pain, joint swelling, arthralgias  and gait problem.   Skin: denies pallor, rash and wound.  Neurological: denies dizziness, seizures, syncope, weakness, light-headedness, numbness and headaches.   Hematological: denies adenopathy, easy bruising, personal or family bleeding history.  Psychiatric/ Behavioral: denies suicidal ideation, mood changes, confusion, nervousness, sleep disturbance and agitation.       All other systems are reviewed and negative.    PHYSICAL EXAM: VS:  BP 122/98 mmHg  Pulse 75  Ht 5\' 8"  (1.727 m)  Wt 81.285 kg (179 lb 3.2 oz)  BMI 27.25 kg/m2 , BMI Body mass index is 27.25 kg/(m^2). GEN: Well nourished, well developed, in no acute distress, appeared anxious at times.  HEENT: normal Neck: no JVD, carotid bruits, or masses Cardiac: RRR; no  murmurs, rubs, or gallops,no edema  Respiratory:  clear to auscultation bilaterally, normal work of breathing GI: soft, nontender, nondistended, + BS MS: no deformity or atrophy Skin: warm and dry, no rash Neuro:  Strength and sensation are intact Psych: normal   EKG:  EKG is ordered today. The ekg ordered today demonstrates NSR at 70.  Small Q waves in the lateral leads - not too different from her ECG during her GXT in 2014.    Recent Labs: No results found for requested labs within last 365 days.    Lipid Panel No results found for: CHOL, TRIG, HDL, CHOLHDL, VLDL, LDLCALC, LDLDIRECT    Wt Readings from Last 3 Encounters:  09/05/14 81.285 kg (179 lb 3.2 oz)  06/14/14 82.01 kg (180 lb 12.8 oz)      Other studies Reviewed: Additional studies/ records that were reviewed today include: review of labs from primary medical doctor . Review of the above records demonstrates: mildly elevated cholesterol, negative 24 hr urine    ASSESSMENT AND PLAN:  1.   Essential hypertension-  Her blood pressure remains mildly elevated. She's been intolerant to diuretics. It also sounds that she's tried an ACE inhibitor but developed a cough.   She has previously had a 24-hour urine for catecholamines, metanephrines, VMAs  and pheochromocytoma has been ruled out.  BP is much better.  Will have her follow up with Dr. Nyoka Cowden  2. Chest discomfort -  She mentioned having some chest discomfort. These episodes typically happen when she has not elevated blood pressure. It's possible that she has some critical coronary artery disease that's contributing to her episodes of hypertension. We'll schedule her for a [employer] study.  Her pains seem to originate in her upper abdomen / upper chest .  Will see her back if the myview is abnormal . She will follow up with Dr. Nyoka Cowden for further evaluation of this .    3. Anxiety: The patient clearly has anxiety. She was anxious about every decision that we may  today. In fact she was so anxious at times that she was not able to decide which drugstore she  wanted her medications sent to.  I suspect that her anxiety  Is playing a role in her symptoms.   Current medicines are reviewed at length with the patient today.  The patient does not have concerns regarding medicines.  The following changes have been made:  no change  Labs/ tests ordered today include:   No orders of the defined types were placed in this encounter.     Disposition:   FU with me as needed    Signed, Nahser, Wonda Cheng, MD  09/05/2014 2:38 PM    [place],  [place], [place]  [address] Phone: [phone]; Fax: [phone]

## 2014-09-20 ENCOUNTER — Ambulatory Visit
Admission: RE | Admit: 2014-09-20 | Discharge: 2014-09-20 | Disposition: A | Discharge status: Home / Self Care | Payer: Medicare Other | Source: Ambulatory Visit

## 2014-09-20 DIAGNOSIS — Z1231 Encounter for screening mammogram for malignant neoplasm of breast: Secondary | ICD-10-CM

## 2014-09-23 ENCOUNTER — Other Ambulatory Visit: Payer: Self-pay | Admitting: Internal Medicine

## 2014-09-23 DIAGNOSIS — R928 Other abnormal and inconclusive findings on diagnostic imaging of breast: Secondary | ICD-10-CM

## 2014-09-25 ENCOUNTER — Telehealth (HOSPITAL_COMMUNITY): Payer: Self-pay

## 2014-09-25 NOTE — Telephone Encounter (Signed)
Patient given detailed instructions per Myocardial Perfusion Study Information Sheet for test on 09-27-2014 at 8:15am. Patient Notified to arrive 15 minutes early, and that it is imperative to arrive on time for appointment to keep from having the test rescheduled. Patient verbalized understanding. Donna Buck, Donna Buck

## 2014-09-27 ENCOUNTER — Ambulatory Visit (HOSPITAL_COMMUNITY): Payer: Medicare Other | Attending: Cardiovascular Disease

## 2014-09-27 ENCOUNTER — Encounter: Payer: Self-pay | Admitting: Nurse Practitioner

## 2014-09-27 ENCOUNTER — Telehealth: Payer: Self-pay | Admitting: Nurse Practitioner

## 2014-09-27 DIAGNOSIS — R0609 Other forms of dyspnea: Secondary | ICD-10-CM | POA: Diagnosis not present

## 2014-09-27 DIAGNOSIS — I10 Essential (primary) hypertension: Secondary | ICD-10-CM | POA: Insufficient documentation

## 2014-09-27 DIAGNOSIS — R0789 Other chest pain: Secondary | ICD-10-CM | POA: Diagnosis not present

## 2014-09-27 DIAGNOSIS — R9439 Abnormal result of other cardiovascular function study: Secondary | ICD-10-CM | POA: Insufficient documentation

## 2014-09-27 DIAGNOSIS — R002 Palpitations: Secondary | ICD-10-CM | POA: Diagnosis not present

## 2014-09-27 LAB — MYOCARDIAL PERFUSION IMAGING
CHL CUP NUCLEAR SRS: 1
CSEPPHR: 81 {beats}/min
LV dias vol: 94 mL
LV sys vol: 28 mL
RATE: 0.27
Rest HR: 54 {beats}/min
SDS: 1
SSS: 2
TID: 1.04

## 2014-09-27 MED ORDER — TECHNETIUM TC 99M SESTAMIBI GENERIC - CARDIOLITE
11.0000 | Freq: Once | INTRAVENOUS | Status: AC | PRN
  Administered 2014-09-27: 11 via INTRAVENOUS

## 2014-09-27 MED ORDER — REGADENOSON 0.4 MG/5ML IV SOLN
0.4000 mg | Freq: Once | INTRAVENOUS | Status: AC
  Administered 2014-09-27: 0.4 mg via INTRAVENOUS

## 2014-09-27 MED ORDER — TECHNETIUM TC 99M SESTAMIBI GENERIC - CARDIOLITE
33.0000 | Freq: Once | INTRAVENOUS | Status: AC | PRN
  Administered 2014-09-27: 33 via INTRAVENOUS

## 2014-09-27 NOTE — Telephone Encounter (Signed)
Results and plan of care reviewed with patient.  Cardiac cath scheduled for Thursday June 30 with Dr. Burt Knack.  Patient scheduled for lab appointment on Tuesday June 28 for pre-cath labs.  I advised her I will review pre-procedure instructions with her at that time.  Patient verbalized understanding and agreement.

## 2014-09-27 NOTE — Telephone Encounter (Signed)
-----   Message from Thayer Headings, MD sent at 09/27/2014  3:55 PM EDT ----- She has some symptoms that are worrisome for ischemia and the myoview is also suspicious.  Can we set her up for a cath next week.  We can use my OV note .  Thanks

## 2014-10-01 ENCOUNTER — Ambulatory Visit
Admission: RE | Admit: 2014-10-01 | Discharge: 2014-10-01 | Disposition: A | Discharge status: Home / Self Care | Payer: Medicare Other | Source: Ambulatory Visit | Attending: Internal Medicine | Admitting: Internal Medicine

## 2014-10-01 ENCOUNTER — Other Ambulatory Visit (INDEPENDENT_AMBULATORY_CARE_PROVIDER_SITE_OTHER): Payer: Medicare Other

## 2014-10-01 DIAGNOSIS — R928 Other abnormal and inconclusive findings on diagnostic imaging of breast: Secondary | ICD-10-CM

## 2014-10-01 DIAGNOSIS — R9439 Abnormal result of other cardiovascular function study: Secondary | ICD-10-CM | POA: Diagnosis not present

## 2014-10-01 DIAGNOSIS — Z5181 Encounter for therapeutic drug level monitoring: Secondary | ICD-10-CM | POA: Diagnosis not present

## 2014-10-01 LAB — BASIC METABOLIC PANEL
BUN: 12 mg/dL (ref 6–23)
CALCIUM: 9.7 mg/dL (ref 8.4–10.5)
CHLORIDE: 106 meq/L (ref 96–112)
CO2: 26 mEq/L (ref 19–32)
Creatinine, Ser: 0.8 mg/dL (ref 0.40–1.20)
GFR: 74.76 mL/min (ref >60.00)
Glucose, Bld: 107 mg/dL — ABNORMAL HIGH (ref 70–99)
Potassium: 3.9 mEq/L (ref 3.5–5.1)
SODIUM: 141 meq/L (ref 135–145)

## 2014-10-01 LAB — PROTIME-INR
INR: 1.1 ratio — AB (ref 0.8–1.0)
Prothrombin Time: 12.6 s (ref 9.6–13.1)

## 2014-10-01 LAB — CBC WITH DIFFERENTIAL/PLATELET
BASOS PCT: 0.7 % (ref 0.0–3.0)
Basophils Absolute: 0 10*3/uL (ref 0.0–0.1)
EOS PCT: 2.2 % (ref 0.0–5.0)
Eosinophils Absolute: 0.1 10*3/uL (ref 0.0–0.7)
HCT: 43.4 % (ref 36.0–46.0)
Hemoglobin: 14.6 g/dL (ref 12.0–15.0)
Lymphocytes Relative: 36.6 % (ref 12.0–46.0)
Lymphs Abs: 2.4 10*3/uL (ref 0.7–4.0)
MCHC: 33.7 g/dL (ref 30.0–36.0)
MCV: 86.9 fl (ref 78.0–100.0)
MONO ABS: 0.5 10*3/uL (ref 0.1–1.0)
MONOS PCT: 7.7 % (ref 3.0–12.0)
NEUTROS PCT: 52.8 % (ref 43.0–77.0)
Neutro Abs: 3.4 10*3/uL (ref 1.4–7.7)
Platelets: 196 10*3/uL (ref 150.0–400.0)
RBC: 4.99 Mil/uL (ref 3.87–5.11)
RDW: 13.3 % (ref 11.5–15.5)
WBC: 6.4 10*3/uL (ref 4.0–10.5)

## 2014-10-02 ENCOUNTER — Encounter (HOSPITAL_COMMUNITY): Payer: Self-pay | Admitting: Pharmacy Technician

## 2014-10-02 ENCOUNTER — Other Ambulatory Visit: Payer: Self-pay | Admitting: Cardiovascular Disease

## 2014-10-03 ENCOUNTER — Ambulatory Visit (HOSPITAL_COMMUNITY)
Admission: RE | Admit: 2014-10-03 | Discharge: 2014-10-03 | Disposition: A | Discharge status: Home / Self Care | Payer: Medicare Other | Source: Ambulatory Visit | Attending: Cardiovascular Disease | Admitting: Cardiovascular Disease

## 2014-10-03 ENCOUNTER — Encounter (HOSPITAL_COMMUNITY)
Admission: RE | Disposition: A | Discharge status: Home / Self Care | Payer: Self-pay | Source: Ambulatory Visit | Attending: Cardiovascular Disease

## 2014-10-03 DIAGNOSIS — R9439 Abnormal result of other cardiovascular function study: Secondary | ICD-10-CM | POA: Diagnosis present

## 2014-10-03 DIAGNOSIS — Z85038 Personal history of other malignant neoplasm of large intestine: Secondary | ICD-10-CM | POA: Diagnosis not present

## 2014-10-03 DIAGNOSIS — I10 Essential (primary) hypertension: Secondary | ICD-10-CM | POA: Insufficient documentation

## 2014-10-03 DIAGNOSIS — E785 Hyperlipidemia, unspecified: Secondary | ICD-10-CM | POA: Diagnosis not present

## 2014-10-03 DIAGNOSIS — F419 Anxiety disorder, unspecified: Secondary | ICD-10-CM | POA: Insufficient documentation

## 2014-10-03 DIAGNOSIS — R079 Chest pain, unspecified: Secondary | ICD-10-CM | POA: Diagnosis present

## 2014-10-03 DIAGNOSIS — R072 Precordial pain: Secondary | ICD-10-CM | POA: Diagnosis not present

## 2014-10-03 HISTORY — PX: CARDIAC CATHETERIZATION: SHX172

## 2014-10-03 SURGERY — LEFT HEART CATH AND CORONARY ANGIOGRAPHY
Anesthesia: LOCAL

## 2014-10-03 MED ORDER — SODIUM CHLORIDE 0.9 % IV SOLN: INTRAVENOUS | Status: DC

## 2014-10-03 MED ORDER — SODIUM CHLORIDE 0.9 % IJ SOLN: 3.0000 mL | Freq: Two times a day (BID) | INTRAMUSCULAR | Status: DC

## 2014-10-03 MED ORDER — ONDANSETRON HCL 4 MG/2ML IJ SOLN: 4.0000 mg | Freq: Four times a day (QID) | INTRAMUSCULAR | Status: DC | PRN

## 2014-10-03 MED ORDER — SODIUM CHLORIDE 0.9 % WEIGHT BASED INFUSION: 1.0000 mL/kg/h | INTRAVENOUS | Status: DC

## 2014-10-03 MED ORDER — HEPARIN (PORCINE) IN NACL 2-0.9 UNIT/ML-% IJ SOLN
INTRAMUSCULAR | Status: AC
  Filled 2014-10-03: qty 1500

## 2014-10-03 MED ORDER — HEPARIN SODIUM (PORCINE) 1000 UNIT/ML IJ SOLN
INTRAMUSCULAR | Status: DC | PRN
  Administered 2014-10-03: 4000 [IU] via INTRAVENOUS

## 2014-10-03 MED ORDER — LIDOCAINE HCL (PF) 1 % IJ SOLN
INTRAMUSCULAR | Status: DC | PRN
  Administered 2014-10-03: 3 mL via SUBCUTANEOUS

## 2014-10-03 MED ORDER — VERAPAMIL HCL 2.5 MG/ML IV SOLN
INTRAVENOUS | Status: AC
  Filled 2014-10-03: qty 2

## 2014-10-03 MED ORDER — IOHEXOL 350 MG/ML SOLN
INTRAVENOUS | Status: DC | PRN
  Administered 2014-10-03: 70 mL via INTRAVENOUS

## 2014-10-03 MED ORDER — MIDAZOLAM HCL 2 MG/2ML IJ SOLN
INTRAMUSCULAR | Status: DC | PRN
  Administered 2014-10-03: 2 mg via INTRAVENOUS

## 2014-10-03 MED ORDER — SODIUM CHLORIDE 0.9 % IV SOLN: 250.0000 mL | INTRAVENOUS | Status: DC | PRN

## 2014-10-03 MED ORDER — HEPARIN SODIUM (PORCINE) 1000 UNIT/ML IJ SOLN
INTRAMUSCULAR | Status: AC
  Filled 2014-10-03: qty 1

## 2014-10-03 MED ORDER — LIDOCAINE HCL (PF) 1 % IJ SOLN
INTRAMUSCULAR | Status: AC
  Filled 2014-10-03: qty 30

## 2014-10-03 MED ORDER — ASPIRIN 81 MG PO CHEW
CHEWABLE_TABLET | ORAL | Status: AC
  Administered 2014-10-03: 81 mg via ORAL
  Filled 2014-10-03: qty 1

## 2014-10-03 MED ORDER — NITROGLYCERIN 1 MG/10 ML FOR IR/CATH LAB
INTRA_ARTERIAL | Status: AC
  Filled 2014-10-03: qty 10

## 2014-10-03 MED ORDER — SODIUM CHLORIDE 0.9 % IJ SOLN: 3.0000 mL | INTRAMUSCULAR | Status: DC | PRN

## 2014-10-03 MED ORDER — SODIUM CHLORIDE 0.9 % WEIGHT BASED INFUSION: 3.0000 mL/kg/h | INTRAVENOUS | Status: DC

## 2014-10-03 MED ORDER — FENTANYL CITRATE (PF) 100 MCG/2ML IJ SOLN
INTRAMUSCULAR | Status: AC
  Filled 2014-10-03: qty 2

## 2014-10-03 MED ORDER — ACETAMINOPHEN 325 MG PO TABS: 650.0000 mg | ORAL_TABLET | ORAL | Status: DC | PRN

## 2014-10-03 MED ORDER — FENTANYL CITRATE (PF) 100 MCG/2ML IJ SOLN
INTRAMUSCULAR | Status: DC | PRN
  Administered 2014-10-03: 25 ug via INTRAVENOUS

## 2014-10-03 MED ORDER — MIDAZOLAM HCL 2 MG/2ML IJ SOLN
INTRAMUSCULAR | Status: AC
  Filled 2014-10-03: qty 2

## 2014-10-03 MED ORDER — VERAPAMIL HCL 2.5 MG/ML IV SOLN
INTRAVENOUS | Status: DC | PRN
  Administered 2014-10-03: 16:00:00 via INTRA_ARTERIAL

## 2014-10-03 MED ORDER — ASPIRIN 81 MG PO CHEW
81.0000 mg | CHEWABLE_TABLET | ORAL | Status: AC
  Administered 2014-10-03: 81 mg via ORAL

## 2014-10-03 SURGICAL SUPPLY — 14 items
CATH BALLN WEDGE 5F 110CM (CATHETERS) IMPLANT
CATH INFINITI 5 FR JL3.5 (CATHETERS) ×2 IMPLANT
CATH INFINITI 5FR ANG PIGTAIL (CATHETERS) ×2 IMPLANT
CATH INFINITI JR4 5F (CATHETERS) ×2 IMPLANT
DEVICE RAD COMP TR BAND LRG (VASCULAR PRODUCTS) ×2 IMPLANT
GLIDESHEATH SLEND SS 6F .021 (SHEATH) ×2 IMPLANT
KIT HEART LEFT (KITS) ×2 IMPLANT
PACK CARDIAC CATHETERIZATION (CUSTOM PROCEDURE TRAY) ×2 IMPLANT
SHEATH FAST CATH BRACH 5F 5CM (SHEATH) IMPLANT
SYR MEDRAD MARK V 150ML (SYRINGE) ×2 IMPLANT
TRANSDUCER W/STOPCOCK (MISCELLANEOUS) ×2 IMPLANT
TUBING CIL FLEX 10 FLL-RA (TUBING) ×2 IMPLANT
WIRE HI TORQ VERSACORE-J 145CM (WIRE) ×2 IMPLANT
WIRE SAFE-T 1.5MM-J .035X260CM (WIRE) ×2 IMPLANT

## 2014-10-03 NOTE — Discharge Instructions (Signed)
Radial Site Care °Refer to this sheet in the next few weeks. These instructions provide you with information on caring for yourself after your procedure. Your caregiver may also give you more specific instructions. Your treatment has been planned according to current medical practices, but problems sometimes occur. Call your caregiver if you have any problems or questions after your procedure. °HOME CARE INSTRUCTIONS °· You may shower the day after the procedure. Remove the bandage (dressing) and gently wash the site with plain soap and water. Gently pat the site dry. °· Do not apply powder or lotion to the site. °· Do not submerge the affected site in water for 3 to 5 days. °· Inspect the site at least twice daily. °· Do not flex or bend the affected arm for 24 hours. °· No lifting over 5 pounds (2.3 kg) for 5 days after your procedure. °· Do not drive home if you are discharged the same day of the procedure. Have someone else drive you. °· You may drive 24 hours after the procedure unless otherwise instructed by your caregiver. °· Do not operate machinery or power tools for 24 hours. °· A responsible adult should be with you for the first 24 hours after you arrive home. °What to expect: °· Any bruising will usually fade within 1 to 2 weeks. °· Blood that collects in the tissue (hematoma) may be painful to the touch. It should usually decrease in size and tenderness within 1 to 2 weeks. °SEEK IMMEDIATE MEDICAL CARE IF: °· You have unusual pain at the radial site. °· You have redness, warmth, swelling, or pain at the radial site. °· You have drainage (other than a small amount of blood on the dressing). °· You have chills. °· You have a fever or persistent symptoms for more than 72 hours. °· You have a fever and your symptoms suddenly get worse. °· Your arm becomes pale, cool, tingly, or numb. °· You have heavy bleeding from the site. Hold pressure on the site. °Document Released: 04/24/2010 Document Revised:  06/14/2011 Document Reviewed: 04/24/2010 °ExitCare® Patient Information ©2015 [employer]. This information is not intended to replace advice given to you by your health care provider. Make sure you discuss any questions you have with your health care provider. ° °

## 2014-10-03 NOTE — H&P (View-Only) (Signed)
Cardiology Office Note   Date:  09/05/2014   ID:  [name], [name] 10-Mar-1941, MRN [id number]  PCP:  Criselda Peaches, MD  Cardiologist:   Thayer Headings, MD   Chief Complaint  Patient presents with  . Chest Pain    Problem list: 1. Hypertension 2. Hyperlipidemia   June 14, 2014: Donna Buck is a 74 y.o. female who presents for  Further evaluation of her HTN She has had intermittent  HTN.. Eats fast foods once a week.   Eats canned foods once a week.   Does not get any exercise.  Bowls for exercise.  Walks her sister's dog 30 minutes some day.   LAbs from Dr. Vonna Kotyk office   total cholesterol was 230  triglyceride levels 183 HDL equals 53 LDL cholesterol 140  24 hour urine for Pheo was normal in 2013.  She has not been able to tolerate diuretics due to leg cramping.   She was on one BP pill that caused her to cough ( ACE - inhibitor)  Non smoker, no ETOH.  She has episodes of CP at times, the pain will radiate out to her arms and through to her back . Worse when BP is high and worse when she walks   September 05, 2014:    she did not want to her the myoview study. She wanted to have a cath  - we explained that this was not the appropriate next step . Her BP is quite variable.  Seems to be troubled by that  needs to exercise   Past Medical History  Diagnosis Date  . Hypertension   . Hyperlipidemia   . Cancer     colon    Past Surgical History  Procedure Laterality Date  . Colon surgery    . Cholecystectomy    . Appendectomy       Current Outpatient Prescriptions  Medication Sig Dispense Refill  . ALPRAZolam (XANAX XR) 0.5 MG 24 hr tablet Take 0.5 mg by mouth daily.    Marland Kitchen aspirin 81 MG tablet Take 81 mg by mouth daily.    . carvedilol (COREG) 25 MG tablet Take 25 mg by mouth 2 (two) times daily with a meal.    . cloNIDine (CATAPRES) 0.1 MG tablet Take 0.1 mg by mouth 2 (two) times daily.    . valsartan (DIOVAN) 160 MG tablet Take 1 tablet (160 mg  total) by mouth daily. 30 tablet 3   No current facility-administered medications for this visit.    Allergies:   Penicillins    Social History:  The patient  reports that she has never smoked. She does not have any smokeless tobacco history on file.   Family History:  The patient's family history includes Diabetes in her maternal aunt; Heart attack in her father; Heart failure in her brother and sister; Hypertension in her mother.    ROS:  Please see the history of present illness.    Review of Systems: Constitutional:  denies fever, chills, diaphoresis, appetite change and fatigue.  HEENT: denies photophobia, eye pain, redness, hearing loss, ear pain, congestion, sore throat, rhinorrhea, sneezing, neck pain, neck stiffness and tinnitus.  Respiratory: admits to SOB, DOE, cough, chest tightness, and wheezing.  Cardiovascular: admits to chest pain, palpitations and leg swelling.  Gastrointestinal: denies nausea, vomiting, abdominal pain, diarrhea, constipation, blood in stool.  Genitourinary: denies dysuria, urgency, frequency, hematuria, flank pain and difficulty urinating.  Musculoskeletal: denies  myalgias, back pain, joint swelling, arthralgias  and gait problem.   Skin: denies pallor, rash and wound.  Neurological: denies dizziness, seizures, syncope, weakness, light-headedness, numbness and headaches.   Hematological: denies adenopathy, easy bruising, personal or family bleeding history.  Psychiatric/ Behavioral: denies suicidal ideation, mood changes, confusion, nervousness, sleep disturbance and agitation.       All other systems are reviewed and negative.    PHYSICAL EXAM: VS:  BP 122/98 mmHg  Pulse 75  Ht 5\' 8"  (1.727 m)  Wt 81.285 kg (179 lb 3.2 oz)  BMI 27.25 kg/m2 , BMI Body mass index is 27.25 kg/(m^2). GEN: Well nourished, well developed, in no acute distress, appeared anxious at times.  HEENT: normal Neck: no JVD, carotid bruits, or masses Cardiac: RRR; no  murmurs, rubs, or gallops,no edema  Respiratory:  clear to auscultation bilaterally, normal work of breathing GI: soft, nontender, nondistended, + BS MS: no deformity or atrophy Skin: warm and dry, no rash Neuro:  Strength and sensation are intact Psych: normal   EKG:  EKG is ordered today. The ekg ordered today demonstrates NSR at 70.  Small Q waves in the lateral leads - not too different from her ECG during her GXT in 2014.    Recent Labs: No results found for requested labs within last 365 days.    Lipid Panel No results found for: CHOL, TRIG, HDL, CHOLHDL, VLDL, LDLCALC, LDLDIRECT    Wt Readings from Last 3 Encounters:  09/05/14 81.285 kg (179 lb 3.2 oz)  06/14/14 82.01 kg (180 lb 12.8 oz)      Other studies Reviewed: Additional studies/ records that were reviewed today include: review of labs from primary medical doctor . Review of the above records demonstrates: mildly elevated cholesterol, negative 24 hr urine    ASSESSMENT AND PLAN:  1.   Essential hypertension-  Her blood pressure remains mildly elevated. She's been intolerant to diuretics. It also sounds that she's tried an ACE inhibitor but developed a cough.   She has previously had a 24-hour urine for catecholamines, metanephrines, VMAs  and pheochromocytoma has been ruled out.  BP is much better.  Will have her follow up with Dr. Nyoka Cowden  2. Chest discomfort -  She mentioned having some chest discomfort. These episodes typically happen when she has not elevated blood pressure. It's possible that she has some critical coronary artery disease that's contributing to her episodes of hypertension. We'll schedule her for a [employer] study.  Her pains seem to originate in her upper abdomen / upper chest .  Will see her back if the myview is abnormal . She will follow up with Dr. Nyoka Cowden for further evaluation of this .    3. Anxiety: The patient clearly has anxiety. She was anxious about every decision that we may  today. In fact she was so anxious at times that she was not able to decide which drugstore she  wanted her medications sent to.  I suspect that her anxiety  Is playing a role in her symptoms.   Current medicines are reviewed at length with the patient today.  The patient does not have concerns regarding medicines.  The following changes have been made:  no change  Labs/ tests ordered today include:   No orders of the defined types were placed in this encounter.     Disposition:   FU with me as needed    Signed, Nahser, Wonda Cheng, MD  09/05/2014 2:38 PM    [place],  [place], [place]  [address] Phone: [phone]; Fax: [phone]

## 2014-10-03 NOTE — Interval H&P Note (Signed)
History and Physical Interval Note:  10/03/2014 4:19 PM  Donna Buck  has presented today for surgery, with the diagnosis of abnormal myoview  The various methods of treatment have been discussed with the patient and family. After consideration of risks, benefits and other options for treatment, the patient has consented to  Procedure(s): Left Heart Cath and Coronary Angiography (N/A) as a surgical intervention .  The patient's history has been reviewed, patient examined, no change in status, stable for surgery.  I have reviewed the patient's chart and labs.  Questions were answered to the patient's satisfaction.     [name]

## 2014-10-04 ENCOUNTER — Encounter (HOSPITAL_COMMUNITY): Payer: Self-pay | Admitting: Cardiovascular Disease

## 2014-10-04 MED FILL — Nitroglycerin IV Soln 100 MCG/ML in D5W: INTRA_ARTERIAL | Qty: 10 | Status: AC

## 2014-10-04 MED FILL — Heparin Sodium (Porcine) 2 Unit/ML in Sodium Chloride 0.9%: INTRAMUSCULAR | Qty: 1500 | Status: AC

## 2014-10-08 ENCOUNTER — Telehealth: Payer: Self-pay | Admitting: Cardiovascular Disease

## 2014-10-08 NOTE — Telephone Encounter (Signed)
Returned call to patient she stated she has been having pain in right wrist at cath site.Cardiac cath done 10/03/14.Stated pain started yesterday.Having chills,nauseated.No fever.Right wrist swollen,no redness.Spoke to [employer] in [employer] he advised to go to ER.

## 2014-10-08 NOTE — Telephone Encounter (Signed)
New message      Pt had heart cath on Thursday June 30.  Pt experiencing cold chills, nausea, unable to eat and pain in right arm.  Please call to advise

## 2014-10-09 NOTE — Telephone Encounter (Signed)
I called to speak with patient.  Her husband states she is sleeping.  I explained the reason for my call and he states the patient did not go to the ER; states she started feeling better after talking to our office.  I advised him that she could call me back with questions or concerns; he verbalized understanding

## 2014-10-10 ENCOUNTER — Other Ambulatory Visit: Payer: Self-pay | Admitting: Gastroenterology

## 2014-10-10 DIAGNOSIS — R110 Nausea: Secondary | ICD-10-CM

## 2014-10-10 DIAGNOSIS — R634 Abnormal weight loss: Secondary | ICD-10-CM

## 2014-10-10 DIAGNOSIS — R1084 Generalized abdominal pain: Secondary | ICD-10-CM

## 2014-10-15 ENCOUNTER — Ambulatory Visit
Admission: RE | Admit: 2014-10-15 | Discharge: 2014-10-15 | Disposition: A | Discharge status: Home / Self Care | Payer: Medicare Other | Source: Ambulatory Visit | Attending: Gastroenterology | Admitting: Gastroenterology

## 2014-10-15 DIAGNOSIS — R1084 Generalized abdominal pain: Secondary | ICD-10-CM

## 2014-10-15 DIAGNOSIS — R110 Nausea: Secondary | ICD-10-CM

## 2014-10-15 DIAGNOSIS — R634 Abnormal weight loss: Secondary | ICD-10-CM

## 2014-10-15 MED ORDER — IOPAMIDOL (ISOVUE-300) INJECTION 61%
100.0000 mL | Freq: Once | INTRAVENOUS | Status: AC | PRN
  Administered 2014-10-15: 100 mL via INTRAVENOUS

## 2014-10-17 NOTE — Progress Notes (Unsigned)
Subject met inclusion and exclusion criteria for the [place]. The informed consent and study requirements were reviewed with the subject and the subject's questions and concerns were addressed prior to the signing of the consent form.  The subject verbalized understanding of the trial requirements and agreed to participate in the trial.  The subject signed the consent form on 10/03/2014. The informed consent was obtained prior to performance of any protocol-specific procedures.  A copy of the signed Informed Consent Form was given to the subject and a copy was placed in the subject's medical record.  Blossom Hoops, RN Clinical Research Nurse [facility]

## 2014-10-28 ENCOUNTER — Other Ambulatory Visit: Payer: Self-pay | Admitting: Internal Medicine

## 2014-10-28 DIAGNOSIS — R921 Mammographic calcification found on diagnostic imaging of breast: Secondary | ICD-10-CM

## 2014-11-05 ENCOUNTER — Ambulatory Visit
Admission: RE | Admit: 2014-11-05 | Discharge: 2014-11-05 | Disposition: A | Discharge status: Home / Self Care | Payer: Medicare Other | Source: Ambulatory Visit | Attending: Internal Medicine | Admitting: Internal Medicine

## 2014-11-05 DIAGNOSIS — R921 Mammographic calcification found on diagnostic imaging of breast: Secondary | ICD-10-CM

## 2015-03-12 ENCOUNTER — Other Ambulatory Visit: Payer: Self-pay | Admitting: Gastroenterology

## 2015-03-12 NOTE — Addendum Note (Signed)
Addended byClarene Essex on: 03/12/2015 02:50 PM   Modules accepted: Orders

## 2015-03-14 ENCOUNTER — Encounter (HOSPITAL_COMMUNITY): Admission: RE | Payer: Self-pay | Source: Ambulatory Visit

## 2015-03-14 ENCOUNTER — Ambulatory Visit (HOSPITAL_COMMUNITY): Admission: RE | Admit: 2015-03-14 | Payer: Medicare Other | Source: Ambulatory Visit | Admitting: Gastroenterology

## 2015-03-14 SURGERY — COLONOSCOPY WITH PROPOFOL
Anesthesia: Monitor Anesthesia Care

## 2015-04-29 ENCOUNTER — Other Ambulatory Visit: Payer: Self-pay | Admitting: Gastroenterology

## 2015-04-30 ENCOUNTER — Encounter (HOSPITAL_COMMUNITY): Payer: Self-pay | Admitting: *Deleted

## 2015-04-30 NOTE — Progress Notes (Signed)
Pt denies any acute cardiopulmonary issues and is not under the care of a cardiologist. Pt stated that she is no longer under the care of Dr. Cathie Olden, cardiology. Pt made aware to stop taking Aspirin, vitamins,  Fish oil and herbal medications. Do not take any NSAIDs ie: Ibuprofen, Advil, Naproxen or any medication containing Aspirin. Pt verbalized understanding of all pre-op instructions.

## 2015-05-05 ENCOUNTER — Encounter (HOSPITAL_COMMUNITY): Payer: Self-pay | Admitting: *Deleted

## 2015-05-05 ENCOUNTER — Ambulatory Visit (HOSPITAL_COMMUNITY)
Admission: RE | Admit: 2015-05-05 | Discharge: 2015-05-05 | Disposition: A | Discharge status: Home / Self Care | Payer: Medicare Other | Source: Ambulatory Visit | Attending: Gastroenterology | Admitting: Gastroenterology

## 2015-05-05 ENCOUNTER — Ambulatory Visit (HOSPITAL_COMMUNITY): Payer: Medicare Other | Admitting: Anesthesiology

## 2015-05-05 ENCOUNTER — Encounter (HOSPITAL_COMMUNITY)
Admission: RE | Disposition: A | Discharge status: Home / Self Care | Payer: Self-pay | Source: Ambulatory Visit | Attending: Gastroenterology

## 2015-05-05 DIAGNOSIS — D122 Benign neoplasm of ascending colon: Secondary | ICD-10-CM | POA: Diagnosis not present

## 2015-05-05 DIAGNOSIS — K644 Residual hemorrhoidal skin tags: Secondary | ICD-10-CM | POA: Insufficient documentation

## 2015-05-05 DIAGNOSIS — Z86010 Personal history of colonic polyps: Secondary | ICD-10-CM | POA: Diagnosis not present

## 2015-05-05 DIAGNOSIS — I10 Essential (primary) hypertension: Secondary | ICD-10-CM | POA: Diagnosis not present

## 2015-05-05 DIAGNOSIS — D125 Benign neoplasm of sigmoid colon: Secondary | ICD-10-CM | POA: Diagnosis present

## 2015-05-05 DIAGNOSIS — K648 Other hemorrhoids: Secondary | ICD-10-CM | POA: Diagnosis not present

## 2015-05-05 DIAGNOSIS — Z85038 Personal history of other malignant neoplasm of large intestine: Secondary | ICD-10-CM | POA: Insufficient documentation

## 2015-05-05 DIAGNOSIS — K449 Diaphragmatic hernia without obstruction or gangrene: Secondary | ICD-10-CM | POA: Diagnosis not present

## 2015-05-05 DIAGNOSIS — M1990 Unspecified osteoarthritis, unspecified site: Secondary | ICD-10-CM | POA: Insufficient documentation

## 2015-05-05 HISTORY — DX: Unspecified macular degeneration: H35.30

## 2015-05-05 HISTORY — PX: COLONOSCOPY WITH PROPOFOL: SHX5780

## 2015-05-05 HISTORY — PX: ESOPHAGOGASTRODUODENOSCOPY (EGD) WITH PROPOFOL: SHX5813

## 2015-05-05 HISTORY — DX: Headache: R51

## 2015-05-05 HISTORY — DX: Gastro-esophageal reflux disease without esophagitis: K21.9

## 2015-05-05 HISTORY — DX: Headache, unspecified: R51.9

## 2015-05-05 HISTORY — DX: Pneumonia, unspecified organism: J18.9

## 2015-05-05 HISTORY — DX: Unspecified osteoarthritis, unspecified site: M19.90

## 2015-05-05 HISTORY — DX: Fatty (change of) liver, not elsewhere classified: K76.0

## 2015-05-05 SURGERY — ESOPHAGOGASTRODUODENOSCOPY (EGD) WITH PROPOFOL
Anesthesia: Monitor Anesthesia Care

## 2015-05-05 MED ORDER — FENTANYL CITRATE (PF) 100 MCG/2ML IJ SOLN
INTRAMUSCULAR | Status: DC | PRN
  Administered 2015-05-05 (×2): 50 ug via INTRAVENOUS

## 2015-05-05 MED ORDER — SODIUM CHLORIDE 0.9 % IV SOLN: INTRAVENOUS | Status: DC

## 2015-05-05 MED ORDER — LIDOCAINE HCL (CARDIAC) 20 MG/ML IV SOLN
INTRAVENOUS | Status: DC | PRN
  Administered 2015-05-05: 50 mg via INTRAVENOUS

## 2015-05-05 MED ORDER — LACTATED RINGERS IV SOLN: INTRAVENOUS | Status: DC

## 2015-05-05 MED ORDER — MIDAZOLAM HCL 5 MG/5ML IJ SOLN
INTRAMUSCULAR | Status: DC | PRN
  Administered 2015-05-05: 2 mg via INTRAVENOUS

## 2015-05-05 MED ORDER — LACTATED RINGERS IV SOLN
INTRAVENOUS | Status: DC
Start: 2015-05-05 — End: 2015-05-05
  Administered 2015-05-05: 1000 mL via INTRAVENOUS
  Administered 2015-05-05: 13:00:00 via INTRAVENOUS

## 2015-05-05 MED ORDER — PROPOFOL 10 MG/ML IV BOLUS
INTRAVENOUS | Status: DC | PRN
  Administered 2015-05-05 (×4): 30 mg via INTRAVENOUS
  Administered 2015-05-05: 20 mg via INTRAVENOUS
  Administered 2015-05-05 (×7): 30 mg via INTRAVENOUS

## 2015-05-05 NOTE — Transfer of Care (Signed)
Immediate Anesthesia Transfer of Care Note  Patient: Donna Buck  Procedure(s) Performed: Procedure(s) with comments: ESOPHAGOGASTRODUODENOSCOPY (EGD) WITH PROPOFOL (N/A) COLONOSCOPY WITH PROPOFOL (N/A) - ultra slim scope  Patient Location: Endoscopy Unit  Anesthesia Type:MAC  Level of Consciousness: awake, alert  and oriented  Airway & Oxygen Therapy: Patient Spontanous Breathing  Post-op Assessment: Report given to RN, Post -op Vital signs reviewed and stable and Patient moving all extremities X 4  Post vital signs: Reviewed and stable  Last Vitals:  Filed Vitals:   05/05/15 1202 05/05/15 1415  BP: 156/76 106/57  Pulse: 58 66  Temp: 36.7 C   Resp: 14 16    Complications: No apparent anesthesia complications

## 2015-05-05 NOTE — Op Note (Addendum)
[facility] [place], [address]   COLONOSCOPY PROCEDURE REPORT     EXAM DATE: 11-May-2015  PATIENT NAME:      Donna Buck, Donna Buck           MR #:      [id number] BIRTHDATE:       February 01, 1941      VISIT #:     [phone]  ATTENDING:     [name], MD     STATUS:     outpatient ASSISTANT:      Carlyn Reichert and William Dalton  INDICATIONS:  The patient is a 75 yr old female here for a colonoscopy due to high risk patient with personal history of colonic polyps and high risk patient with personal history of colon cancer. PROCEDURE PERFORMED:     Colonoscopy with biopsy MEDICATIONS:     Propofol 260 mg IV, Fentanyl 100 mcg IV, and Versed 2 mg IV  50 mg lidocaine ESTIMATED BLOOD LOSS:     None  CONSENT: The patient understands the risks and benefits of the procedure and understands that these risks include, but are not limited to: sedation, allergic reaction, infection, perforation and/or bleeding. Alternative means of evaluation and treatment include, among others: physical exam, x-rays, and/or surgical intervention. The patient elects to proceed with this endoscopic procedure.  DESCRIPTION OF PROCEDURE: During intra-op preparation period all mechanical & medical equipment was checked for proper function. Hand hygiene and appropriate measures for infection prevention was taken. After the risks, benefits and alternatives of the procedure were thoroughly explained, Informed consent was verified, confirmed and timeout was successfully executed by the treatment team. A digital exam revealed external hemorrhoids. The Pentax Ultra Slim [id number] endoscope was introduced through the anus and advanced to the ileum.The prep was adequate The instrument was then slowly withdrawn as the colon was fully examined.Estimated blood loss is zero unless otherwise noted in this procedure report. the findings are recorded below      Retroflexed views  revealed internal hemorrhoids. The scope was then completely withdrawn from the patient and the procedure terminated. SCOPE WITHDRAWAL TIME: see nurse's note    ADVERSE EVENTS:      There were no immediate complications.  IMPRESSIONS:     #1 internal/external hemorrhoids 2. Tiny rectal distal sigmoid probable hyperplastic-appearing polyps cold biopsied 3. Ascending colon anastomosis colon end the small bowel side 4. Otherwise within normal limits to the terminal ileum  RECOMMENDATIONS:     await pathology and repeat colon screening in 5 years and continue workup with an EGD RECALL:     5 years or as needed  _____________________________ [name], MD eSigned:  [name], MD 2015-05-11 3:17 PM Revised: 2015/05/11 3:17 PM  cc:   CPT CODES: ICD CODES:  The ICD and CPT codes recommended by this software are interpretations from the data that the clinical staff has captured with the software.  The verification of the translation of this report to the ICD and CPT codes and modifiers is the sole responsibility of the health care institution and practicing physician where this report was generated.  [employer] will not be held responsible for the validity of the ICD and CPT codes included on this report.  AMA assumes no liability for data contained or not contained herein. CPT is a Designer, television/film set of the [employer].   PATIENT NAME:  Donna Buck MR#: [id number]

## 2015-05-05 NOTE — Op Note (Signed)
[facility] [place], [address]   ENDOSCOPY PROCEDURE REPORT  PATIENT: Donna Buck, Donna Buck  MR#: [id number] BIRTHDATE: [birth year] , 47  yrs. old GENDER: female ENDOSCOPIST: [name], MD REFERRED BY:  Levin Erp, M.D. PROCEDURE DATE:  05/31/15 PROCEDURE:  EGD, diagnostic ASA CLASS:     Class II INDICATIONS:  nausea. MEDICATIONS: Propofol 90 mg IV TOPICAL ANESTHETIC: none  DESCRIPTION OF PROCEDURE: After the risks benefits and alternatives of the procedure were thoroughly explained, informed consent was obtained.  The Pentax Gastroscope [id number] endoscope was introduced through the mouth and advanced to the second portion of the duodenum , Without limitations.  The instrument was slowly withdrawn as the mucosa was fully examined. Estimated blood loss is zero unless otherwise noted in this procedure report.    The findings are recorded below the       Retroflexed views revealed a hiatal hernia.     The scope was then withdrawn from the patient and the procedure completed.  COMPLICATIONS: There were no immediate complications.  ENDOSCOPIC IMPRESSION: 1. Small hiatal hernia 2. Otherwise within normal limits EGD  RECOMMENDATIONS: trial of nighttime pump inhibitors over-the-counter call me when necessary otherwise follow-up in 2 months  REPEAT EXAM: as needed  eSigned:  [name], MD 31-May-2015 2:25 PM    CC:  CPT CODES: ICD CODES:  The ICD and CPT codes recommended by this software are interpretations from the data that the clinical staff has captured with the software.  The verification of the translation of this report to the ICD and CPT codes and modifiers is the sole responsibility of the health care institution and practicing physician where this report was generated.  [employer] will not be held responsible for the validity of the ICD and CPT codes included on this report.  AMA assumes no liability  for data contained or not contained herein. CPT is a Designer, television/film set of the [employer].  PATIENT NAME:  Donna Buck, Donna Buck MR#: [id number]

## 2015-05-05 NOTE — Progress Notes (Signed)
Donna Buck 12:10 PM  Subjective: Patient without any new symptoms since we saw her in the office and we reviewed her CT from this summer and her previous colonoscopies  Objective: Vital signs stable afebrile no acute distress exam please see preassessment evaluation  Assessment: History of colon cancer and colon polyps as well as some a.m. nausea  Plan: Okay to proceed with colonoscopy and endoscopy with anesthesia assistance  [id number] [facility] [address]  Pager [phone] After 5PM or if no answer call [phone]

## 2015-05-05 NOTE — Anesthesia Procedure Notes (Signed)
Procedure Name: MAC Date/Time: 05/05/2015 1:40 PM Performed by: Mariea Clonts Pre-anesthesia Checklist: Patient identified, Timeout performed, Emergency Drugs available, Suction available and Patient being monitored Patient Re-evaluated:Patient Re-evaluated prior to inductionOxygen Delivery Method: Nasal cannula Preoxygenation: Pre-oxygenation with 100% oxygen

## 2015-05-05 NOTE — Discharge Instructions (Signed)
Call if question or problem otherwise call for biopsy report in 1 week and follow-up in 2 months and try over-the-counter Prilosec or Nexium at night before bed to see if that will help your a.m. nauseaEsophagogastroduodenoscopy, Care After Refer to this sheet in the next few weeks. These instructions provide you with information about caring for yourself after your procedure. Your health care provider may also give you more specific instructions. Your treatment has been planned according to current medical practices, but problems sometimes occur. Call your health care provider if you have any problems or questions after your procedure. WHAT TO EXPECT AFTER THE PROCEDURE After your procedure, it is typical to feel:  Soreness in your throat.  Pain with swallowing.  Sick to your stomach (nauseous).  Bloated.  Dizzy.  Fatigued. HOME CARE INSTRUCTIONS  Do not eat or drink anything until the numbing medicine (local anesthetic) has worn off and your gag reflex has returned. You will know that the local anesthetic has worn off when you can swallow comfortably.  Do not drive or operate machinery until directed by your health care provider.  Take medicines only as directed by your health care provider. SEEK MEDICAL CARE IF:   You cannot stop coughing.  You are not urinating at all or less than usual. SEEK IMMEDIATE MEDICAL CARE IF:  You have difficulty swallowing.  You cannot eat or drink.  You have worsening throat or chest pain.  You have dizziness or lightheadedness or you faint.  You have nausea or vomiting.  You have chills.  You have a fever.  You have severe abdominal pain.  You have black, tarry, or bloody stools.   This information is not intended to replace advice given to you by your health care provider. Make sure you discuss any questions you have with your health care provider.   Document Released: 03/08/2012 Document Revised: 04/12/2014 Document Reviewed:  03/08/2012 Elsevier Interactive Patient Education 2016 [employer]. Colonoscopy, Care After These instructions give you information on caring for yourself after your procedure. Your doctor may also give you more specific instructions. Call your doctor if you have any problems or questions after your procedure. HOME CARE  Do not drive for 24 hours.  Do not sign important papers or use machinery for 24 hours.  You may shower.  You may go back to your usual activities, but go slower for the first 24 hours.  Take rest breaks often during the first 24 hours.  Walk around or use warm packs on your belly (abdomen) if you have belly cramping or gas.  Drink enough fluids to keep your pee (urine) clear or pale yellow.  Resume your normal diet. Avoid heavy or fried foods.  Avoid drinking alcohol for 24 hours or as told by your doctor.  Only take medicines as told by your doctor. If a tissue sample (biopsy) was taken during the procedure:   Do not take aspirin or blood thinners for 7 days, or as told by your doctor.  Do not drink alcohol for 7 days, or as told by your doctor.  Eat soft foods for the first 24 hours. GET HELP IF: You still have a small amount of blood in your poop (stool) 2-3 days after the procedure. GET HELP RIGHT AWAY IF:  You have more than a small amount of blood in your poop.  You see clumps of tissue (blood clots) in your poop.  Your belly is puffy (swollen).  You feel sick to your stomach (nauseous)  or throw up (vomit).  You have a fever.  You have belly pain that gets worse and medicine does not help. MAKE SURE YOU:  Understand these instructions.  Will watch your condition.  Will get help right away if you are not doing well or get worse.   This information is not intended to replace advice given to you by your health care provider. Make sure you discuss any questions you have with your health care provider.   Document Released: 04/24/2010  Document Revised: 03/27/2013 Document Reviewed: 11/27/2012 Elsevier Interactive Patient Education [employer].

## 2015-05-05 NOTE — Anesthesia Preprocedure Evaluation (Addendum)
Anesthesia Evaluation  Patient identified by MRN, date of birth, ID band Patient awake    Reviewed: Allergy & Precautions, NPO status , Patient's Chart, lab work & pertinent test results, reviewed documented beta blocker date and time   Airway Mallampati: II  TM Distance: >3 FB Neck ROM: Full    Dental no notable dental hx.    Pulmonary pneumonia,    Pulmonary exam normal breath sounds clear to auscultation       Cardiovascular hypertension, Pt. on medications and Pt. on home beta blockers Normal cardiovascular exam Rhythm:Regular Rate:Normal  Echo 09/2014 - Left ventricle: The cavity size was normal. Systolic function wasnormal. The estimated ejection fraction was in the range of 55%to 60%. Wall motion was normal; there were no regional wallmotion abnormalities. Features are consistent with a pseudonormalleft ventricular filling pattern, with concomitant abnormalrelaxation and increased filling pressure (grade 2 diastolicdysfunction). - Aortic valve: Trileaflet; normal thickness, mildly calcified leaflets. - Aorta: Ascending aortic diameter: 39 mm (S). - Ascending aorta: The ascending aorta was mildly dilated. - Mitral valve: There was mild regurgitation. - Pulmonic valve: There was trivial regurgitation.    Neuro/Psych  Headaches, negative psych ROS   GI/Hepatic Neg liver ROS, GERD  ,  Endo/Other  negative endocrine ROS  Renal/GU negative Renal ROS     Musculoskeletal  (+) Arthritis ,   Abdominal   Peds  Hematology negative hematology ROS (+)   Anesthesia Other Findings   Reproductive/Obstetrics negative OB ROS                           Anesthesia Physical Anesthesia Plan  ASA: II  Anesthesia Plan: MAC   Post-op Pain Management:    Induction: Intravenous  Airway Management Planned:   Additional Equipment:   Intra-op Plan:   Post-operative Plan:   Informed Consent: I  have reviewed the patients History and Physical, chart, labs and discussed the procedure including the risks, benefits and alternatives for the proposed anesthesia with the patient or authorized representative who has indicated his/her understanding and acceptance.   Dental advisory given  Plan Discussed with: CRNA  Anesthesia Plan Comments:         Anesthesia Quick Evaluation

## 2015-05-05 NOTE — Anesthesia Postprocedure Evaluation (Signed)
Anesthesia Post Note  Patient: Donna Buck  Procedure(s) Performed: Procedure(s) (LRB): ESOPHAGOGASTRODUODENOSCOPY (EGD) WITH PROPOFOL (N/A) COLONOSCOPY WITH PROPOFOL (N/A)  Patient location during evaluation: PACU Anesthesia Type: MAC Level of consciousness: awake and alert Pain management: pain level controlled Vital Signs Assessment: post-procedure vital signs reviewed and stable Respiratory status: spontaneous breathing Cardiovascular status: stable Anesthetic complications: no    Last Vitals:  Filed Vitals:   05/05/15 1430 05/05/15 1440  BP: 164/60 174/66  Pulse: 63 62  Temp:    Resp: 19 19    Last Pain: There were no vitals filed for this visit.               Nolon Nations

## 2015-05-06 ENCOUNTER — Encounter (HOSPITAL_COMMUNITY): Payer: Self-pay | Admitting: Gastroenterology

## 2015-09-24 ENCOUNTER — Other Ambulatory Visit: Payer: Self-pay | Admitting: Internal Medicine

## 2015-09-24 DIAGNOSIS — R921 Mammographic calcification found on diagnostic imaging of breast: Secondary | ICD-10-CM

## 2015-10-08 ENCOUNTER — Ambulatory Visit
Admission: RE | Admit: 2015-10-08 | Discharge: 2015-10-08 | Disposition: A | Discharge status: Home / Self Care | Payer: Medicare Other | Source: Ambulatory Visit | Attending: Internal Medicine | Admitting: Internal Medicine

## 2015-10-08 DIAGNOSIS — R921 Mammographic calcification found on diagnostic imaging of breast: Secondary | ICD-10-CM

## 2015-11-27 ENCOUNTER — Encounter (HOSPITAL_COMMUNITY): Payer: Self-pay | Admitting: *Deleted

## 2015-11-27 ENCOUNTER — Emergency Department (HOSPITAL_COMMUNITY): Payer: Medicare Other

## 2015-11-27 ENCOUNTER — Emergency Department (HOSPITAL_COMMUNITY)
Admission: EM | Admit: 2015-11-27 | Discharge: 2015-11-27 | Disposition: A | Discharge status: Home / Self Care | Payer: Medicare Other | Attending: Emergency Medicine | Admitting: Emergency Medicine

## 2015-11-27 DIAGNOSIS — H538 Other visual disturbances: Secondary | ICD-10-CM | POA: Diagnosis not present

## 2015-11-27 DIAGNOSIS — R791 Abnormal coagulation profile: Secondary | ICD-10-CM | POA: Insufficient documentation

## 2015-11-27 DIAGNOSIS — I10 Essential (primary) hypertension: Secondary | ICD-10-CM | POA: Diagnosis not present

## 2015-11-27 DIAGNOSIS — Z85038 Personal history of other malignant neoplasm of large intestine: Secondary | ICD-10-CM | POA: Insufficient documentation

## 2015-11-27 LAB — COMPREHENSIVE METABOLIC PANEL
ALBUMIN: 4.3 g/dL (ref 3.5–5.0)
ALK PHOS: 91 U/L (ref 38–126)
ALT: 51 U/L (ref 14–54)
ANION GAP: 7 (ref 5–15)
AST: 36 U/L (ref 15–41)
BUN: 12 mg/dL (ref 6–20)
CALCIUM: 9.5 mg/dL (ref 8.9–10.3)
CHLORIDE: 108 mmol/L (ref 101–111)
CO2: 25 mmol/L (ref 22–32)
Creatinine, Ser: 1.04 mg/dL — ABNORMAL HIGH (ref 0.44–1.00)
GFR calc Af Amer: 60 mL/min — ABNORMAL LOW (ref >60)
GFR calc non Af Amer: 52 mL/min — ABNORMAL LOW (ref >60)
GLUCOSE: 147 mg/dL — AB (ref 65–99)
Potassium: 3.7 mmol/L (ref 3.5–5.1)
SODIUM: 140 mmol/L (ref 135–145)
Total Bilirubin: 0.9 mg/dL (ref 0.3–1.2)
Total Protein: 6.9 g/dL (ref 6.5–8.1)

## 2015-11-27 LAB — CBC
HCT: 42.1 % (ref 36.0–46.0)
Hemoglobin: 14.1 g/dL (ref 12.0–15.0)
MCH: 29.2 pg (ref 26.0–34.0)
MCHC: 33.5 g/dL (ref 30.0–36.0)
MCV: 87.2 fL (ref 78.0–100.0)
PLATELETS: 173 10*3/uL (ref 150–400)
RBC: 4.83 MIL/uL (ref 3.87–5.11)
RDW: 12.7 % (ref 11.5–15.5)
WBC: 5.9 10*3/uL (ref 4.0–10.5)

## 2015-11-27 LAB — DIFFERENTIAL
BASOS PCT: 1 %
Basophils Absolute: 0 10*3/uL (ref 0.0–0.1)
EOS PCT: 3 %
Eosinophils Absolute: 0.2 10*3/uL (ref 0.0–0.7)
LYMPHS PCT: 39 %
Lymphs Abs: 2.3 10*3/uL (ref 0.7–4.0)
Monocytes Absolute: 0.4 10*3/uL (ref 0.1–1.0)
Monocytes Relative: 6 %
NEUTROS ABS: 3.1 10*3/uL (ref 1.7–7.7)
NEUTROS PCT: 51 %

## 2015-11-27 LAB — I-STAT TROPONIN, ED: Troponin i, poc: 0 ng/mL (ref 0.00–0.08)

## 2015-11-27 LAB — PROTIME-INR
INR: 1.17
PROTHROMBIN TIME: 15 s (ref 11.4–15.2)

## 2015-11-27 LAB — APTT: aPTT: 33 seconds (ref 24–36)

## 2015-11-27 NOTE — ED Triage Notes (Addendum)
Pt c/o hypertension onset tonight, with hx of the same. Reports taking amlodipine and 162mg  ASA. Has had blurry vision to L eye and numbness to bilateral feet, symptoms have since resolved. Had a previous episode on Sunday as well. Pt also reports chest pressure onset tonight after dinner. Denies cardiac hx

## 2015-11-27 NOTE — Discharge Instructions (Signed)
If blood pressure is elevated at home he can take 1 additional clonidine tablet. If blood pressure comes down, you don't need to do anything further other than follow-up with your doctor. If blood pressure does not come down within 1 hour of taking additional clonidine, come back to the ER.

## 2015-11-27 NOTE — ED Provider Notes (Signed)
[facility] DEPT Provider Note   CSN: [id number] Arrival date & time: 11/27/15  0036  By signing my name below, I, Donna Buck, attest that this documentation has been prepared under the direction and in the presence of [name], MD . Electronically Signed: Higinio Buck, Scribe. 11/27/2015. 3:20 AM.  History   Chief Complaint Chief Complaint  Patient presents with  . Hypertension   The history is provided by the patient. No language interpreter was used.   HPI Comments: Donna Buck is a 75 y.o. female with PMHx of HTN, HLD and colon cancer, who presents to the Emergency Department for an evaluation of intermittent, hypertension that began 3 days ago and worsened today. Pt reports her blood pressure was 204/87 upon arrival in the ED. She notes associated urinary frequency, "mouth dryness," blurry vision in her left eye, nausea and chest pressure that began 3 days ago but is now resolved in the ED. Pt reports her HTN is monitored by her PCP and states he recently prescribed her amlodipine BID and 0.1 mg clonidine. She denies any cardiac hx.   Past Medical History:  Diagnosis Date  . Arthritis   . Cancer ([place])    colon  . Fatty liver    mild  . GERD (gastroesophageal reflux disease)   . Headache   . Hyperlipidemia   . Hypertension   . Macular degeneration   . Pneumonia     Patient Active Problem List   Diagnosis Date Noted  . Chest pain 09/05/2014  . HTN (hypertension) 06/14/2014    Past Surgical History:  Procedure Laterality Date  . APPENDECTOMY    . BREAST SURGERY     right breast biopsy  . CARDIAC CATHETERIZATION N/A 10/03/2014   Procedure: Left Heart Cath and Coronary Angiography;  Surgeon: [name], MD;  Location: [facility];  Service: Cardiovascular;  Laterality: N/A;  . CATARACT EXTRACTION W/ INTRAOCULAR LENS  IMPLANT, BILATERAL    . CHOLECYSTECTOMY    . COLON SURGERY    . COLONOSCOPY WITH PROPOFOL N/A 05/05/2015   Procedure:  COLONOSCOPY WITH PROPOFOL;  Surgeon: [name], MD;  Location: [facility] ENDOSCOPY;  Service: Endoscopy;  Laterality: N/A;  ultra slim scope  . ESOPHAGOGASTRODUODENOSCOPY (EGD) WITH PROPOFOL N/A 05/05/2015   Procedure: ESOPHAGOGASTRODUODENOSCOPY (EGD) WITH PROPOFOL;  Surgeon: [name], MD;  Location: [facility] ENDOSCOPY;  Service: Endoscopy;  Laterality: N/A;    OB History    No data available     Home Medications    Prior to Admission medications   Medication Sig Start Date End Date Taking? Authorizing Provider  ALPRAZolam (XANAX XR) 0.5 MG 24 hr tablet Take 0.5 mg by mouth daily as needed for anxiety.     Historical Provider, MD  Ascorbic Acid (VITAMIN C PO) Take 1 tablet by mouth daily.     Historical Provider, MD  CALCIUM PO Take 1 tablet by mouth daily.     Historical Provider, MD  carvedilol (COREG) 25 MG tablet Take 25 mg by mouth 2 (two) times daily with a meal.    Historical Provider, MD  Cholecalciferol (VITAMIN D-3 PO) Take 1 tablet by mouth daily.     Historical Provider, MD  cloNIDine (CATAPRES) 0.1 MG tablet Take 0.1 mg by mouth 2 (two) times daily.    Historical Provider, MD  ibuprofen (ADVIL,MOTRIN) 200 MG tablet Take 200 mg by mouth daily as needed for moderate pain.     Historical Provider, MD  Multiple Vitamins-Minerals (MULTIVITAMIN ADULT) CHEW Chew  1 tablet by mouth daily.    Historical Provider, MD    Family History Family History  Problem Relation Age of Onset  . Hypertension Mother   . Heart attack Father   . Heart failure Sister   . Heart failure Brother   . Diabetes Maternal Aunt     Social History Social History  Substance Use Topics  . Smoking status: Never Smoker  . Smokeless tobacco: Never Used  . Alcohol use No   Allergies   Penicillins and Isovue [iopamidol]  Review of Systems Review of Systems  Eyes: Positive for visual disturbance.  Gastrointestinal: Positive for nausea.  Genitourinary: Positive for frequency.   Physical Exam Updated Vital  Signs BP 188/97 (BP Location: Right Arm)   Pulse 71   Temp 97.4 F (36.3 C) (Oral)   Resp 16   Ht 5\' 8"  (1.727 m)   Wt 170 lb (77.1 kg)   SpO2 100%   BMI 25.85 kg/m   Physical Exam  Constitutional: She is oriented to person, place, and time. She appears well-developed and well-nourished. No distress.  HENT:  Head: Normocephalic and atraumatic.  Right Ear: Hearing normal.  Left Ear: Hearing normal.  Nose: Nose normal.  Mouth/Throat: Oropharynx is clear and moist and mucous membranes are normal.  Eyes: Conjunctivae and EOM are normal. Pupils are equal, round, and reactive to light.  Neck: Normal range of motion. Neck supple.  Cardiovascular: Regular rhythm, S1 normal and S2 normal.  Exam reveals no gallop and no friction rub.   No murmur heard. Pulmonary/Chest: Effort normal and breath sounds normal. No respiratory distress. She exhibits no tenderness.  Abdominal: Soft. Normal appearance and bowel sounds are normal. There is no hepatosplenomegaly. There is no tenderness. There is no rebound, no guarding, no tenderness at McBurney's point and negative Murphy's sign. No hernia.  Musculoskeletal: Normal range of motion.  Neurological: She is alert and oriented to person, place, and time. She has normal strength. No cranial nerve deficit or sensory deficit. Coordination normal. GCS eye subscore is 4. GCS verbal subscore is 5. GCS motor subscore is 6.  Skin: Skin is warm, dry and intact. No rash noted. No cyanosis.  Psychiatric: She has a normal mood and affect. Her speech is normal and behavior is normal.  Anxious   Nursing note and vitals reviewed.  ED Treatments / Results  Labs (all labs ordered are listed, but only abnormal results are displayed) Labs Reviewed  COMPREHENSIVE METABOLIC PANEL - Abnormal; Notable for the following:       Result Value   Glucose, Bld 147 (*)    Creatinine, Ser 1.04 (*)    GFR calc non Af Amer 52 (*)    GFR calc Af Amer 60 (*)    All other  components within normal limits  PROTIME-INR  APTT  CBC  DIFFERENTIAL  I-STAT TROPOININ, ED    EKG  EKG Interpretation None       Radiology Dg Chest 2 View  Result Date: 11/27/2015 CLINICAL DATA:  75 y/o  F; 3 days of chest pain. EXAM: CHEST  2 VIEW COMPARISON:  Chest radiograph 10/06/2007 FINDINGS: Stable cardiac silhouette given differences in technique. Prominent epicardial fat pad. Clear lungs. No pneumothorax. No consolidation. Mild biapical pleural parenchymal scarring. No acute osseous abnormality is evident. Mild degenerative changes of the thoracic spine with S-shaped curvature. IMPRESSION: No active cardiopulmonary disease. Electronically Signed   By: Kristine Garbe M.D.   On: 11/27/2015 01:24   Ct Head Wo  Contrast  Result Date: 11/27/2015 CLINICAL DATA:  Hypertension and blurred vision EXAM: CT HEAD WITHOUT CONTRAST TECHNIQUE: Contiguous axial images were obtained from the base of the skull through the vertex without intravenous contrast. COMPARISON:  None available FINDINGS: Brain: Unremarkable. No evidence of acute infarction, hemorrhage, hydrocephalus, extra-axial collection or mass lesion/mass effect. Vascular: Atherosclerotic calcification. Skull: No acute or aggressive finding. Sinuses/Orbits: 18 mm polyp or retention cyst narrowing right maxillary outflow. Bilateral cataract resection. IMPRESSION: 1. No acute finding.  Unremarkable appearance of the brain. 2. Polyp or retention cyst narrows the right maxillary ostium. Electronically Signed   By: Monte Fantasia M.D.   On: 11/27/2015 01:17   Procedures Procedures  DIAGNOSTIC STUDIES:  Oxygen Saturation is 100% on RA, normal by my interpretation.    COORDINATION OF CARE:  3:16 AM Discussed treatment Buck with pt at bedside and pt agreed to Buck.  Medications Ordered in ED Medications - No data to display  Initial Impression / Assessment and Buck / ED Course  I have reviewed the triage vital signs and  the nursing notes.  Pertinent labs & imaging results that were available during my care of the patient were reviewed by me and considered in my medical decision making (see chart for details).  Clinical Course    Presents to the ER for evaluation of accelerated hypertension. Patient reports that she has a history of chronic hypertension and has been experiencing periodic episodes of blood pressure above [id number] systolic. She has been seen by her doctor and recently had clonidine added. She did have another episode tonight where she became weak and had a headache associated with elevated blood pressure. She had an episode of chest pain several days ago with elevated blood pressure but none tonight. EKG, troponin negative. Blood work normal including creatinine. No evidence of end organ injury. Blood pressure has improved without intervention here in the ER. I suspect there is an element of anxiety and she is checking the blood pressure too frequently and focusing on it. I did, however, tell her that she could take an additional clonidine, as she is only taking 0.1 mg twice a day. When she takes her blood pressure and it is above A999333 systolic she can take an additional clonidine and if symptoms and blood pressure improved she does not need to do anything further. She is to follow-up with her primary doctor in the office next week. She can return to the ER for elevated blood pressure does not respond to clonidine.  I personally performed the services described in this documentation, which was scribed in my presence. The recorded information has been reviewed and is accurate.   Final Clinical Impressions(s) / ED Diagnoses   Final diagnoses:  None   Hypertension  New Prescriptions New Prescriptions   No medications on file     [name], MD 11/27/15 [phone]

## 2016-09-11 IMAGING — US US BREAST LTD UNI RIGHT INC AXILLA
1 series · 8 of 8 positions shown · non-contrast
Comparison: 09/20/2014, 02/22/2013, 03/19/2011, 03/21/2009.

CLINICAL DATA: Recall from screening mammogram.

EXAM:
DIGITAL DIAGNOSTIC RIGHT MAMMOGRAM WITH 3D TOMOSYNTHESIS
ULTRASOUND RIGHT BREAST

[Series 1: advbreast · 8 of 8 slices shown]
[im 1/8]
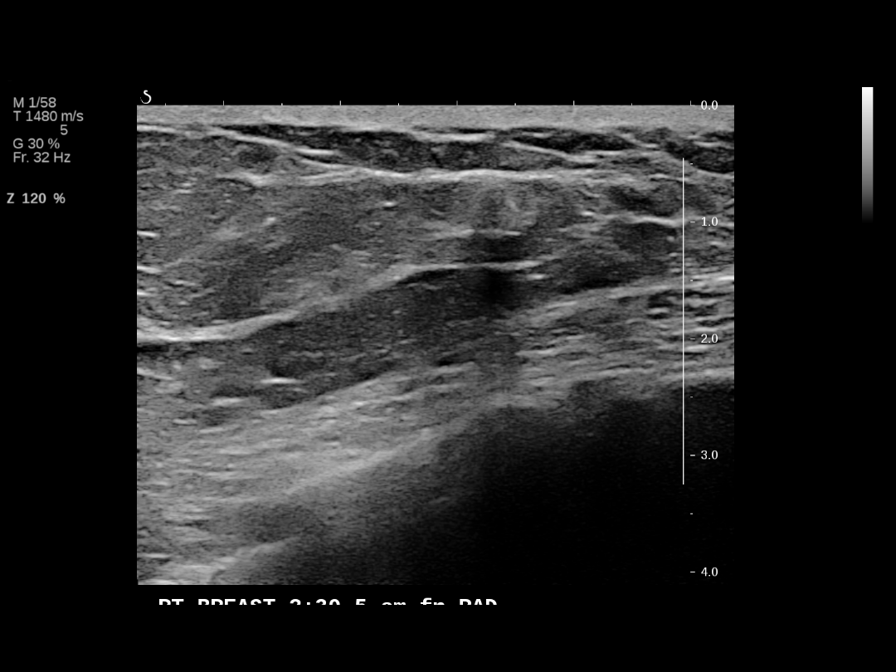
[im 2/8]
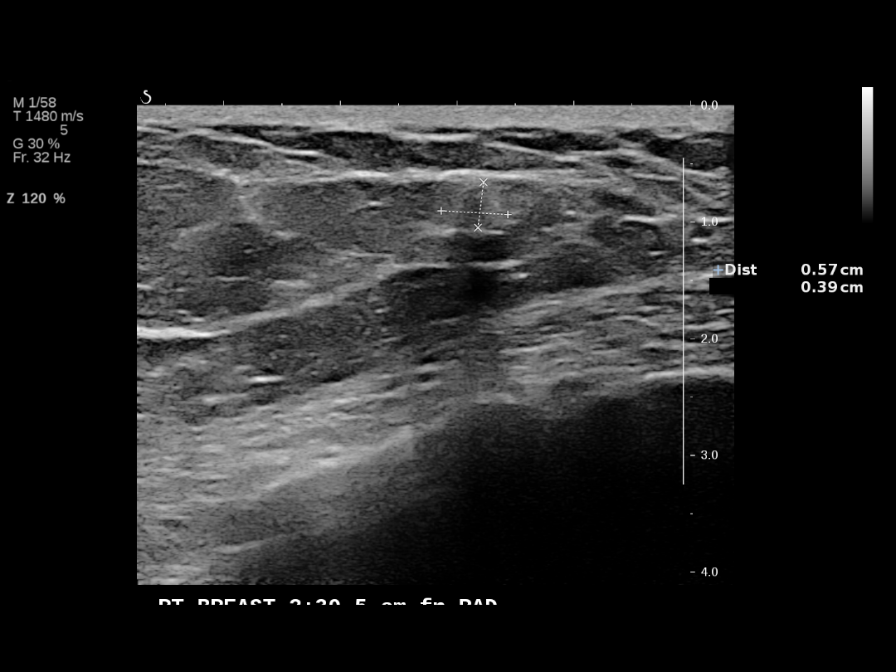
[im 3/8]
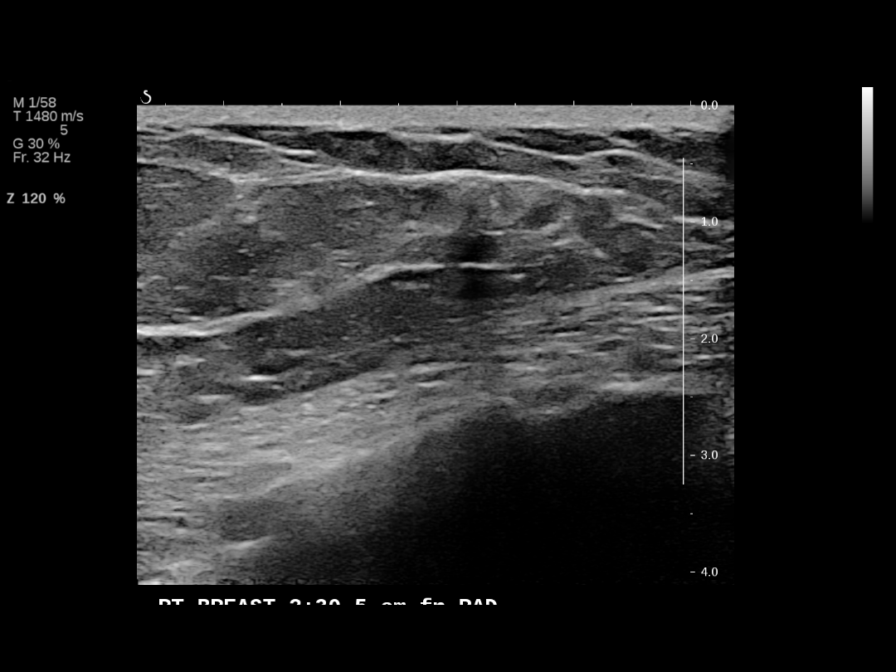
[im 4/8]
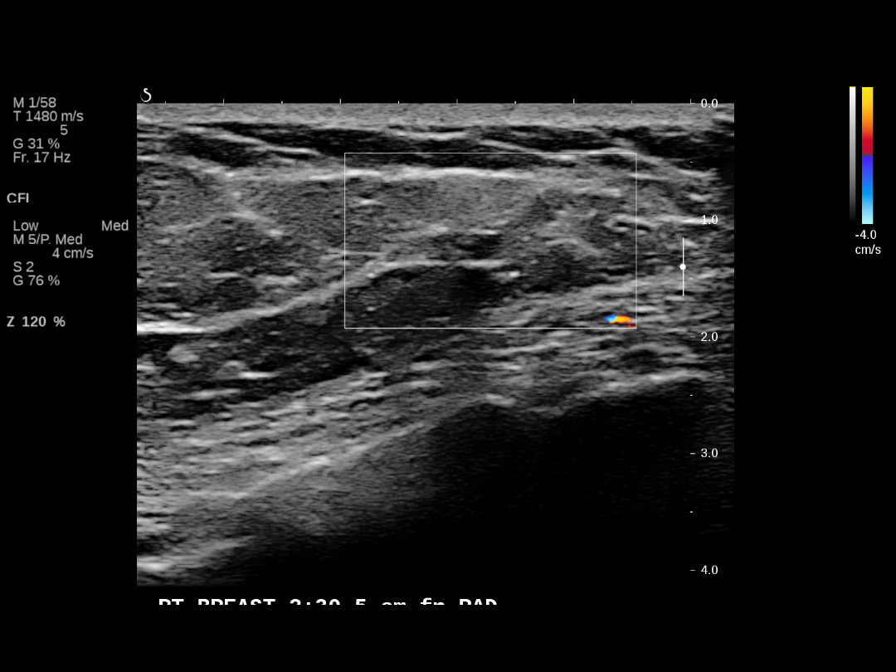
[im 5/8]
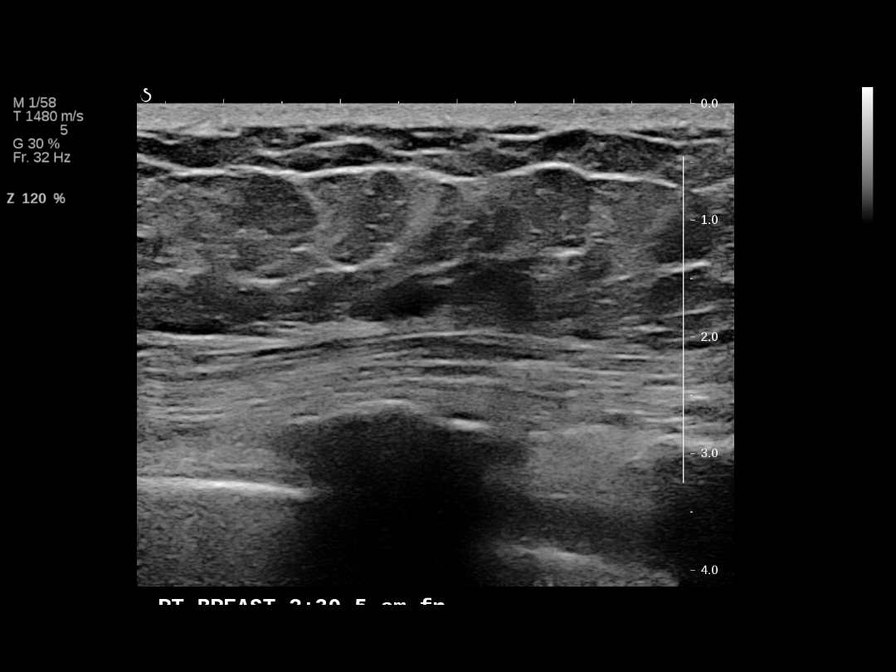
[im 6/8]
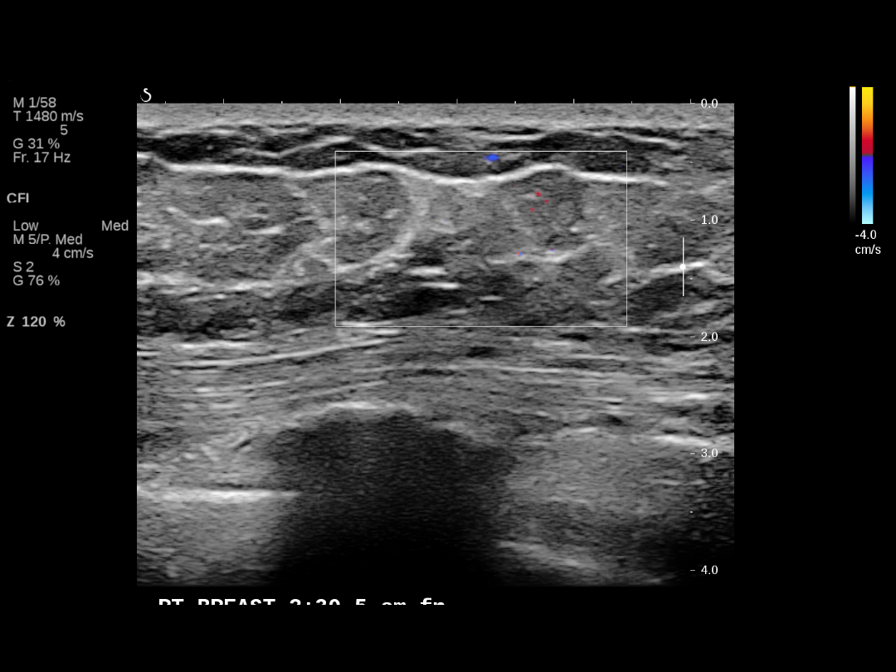
[im 7/8]
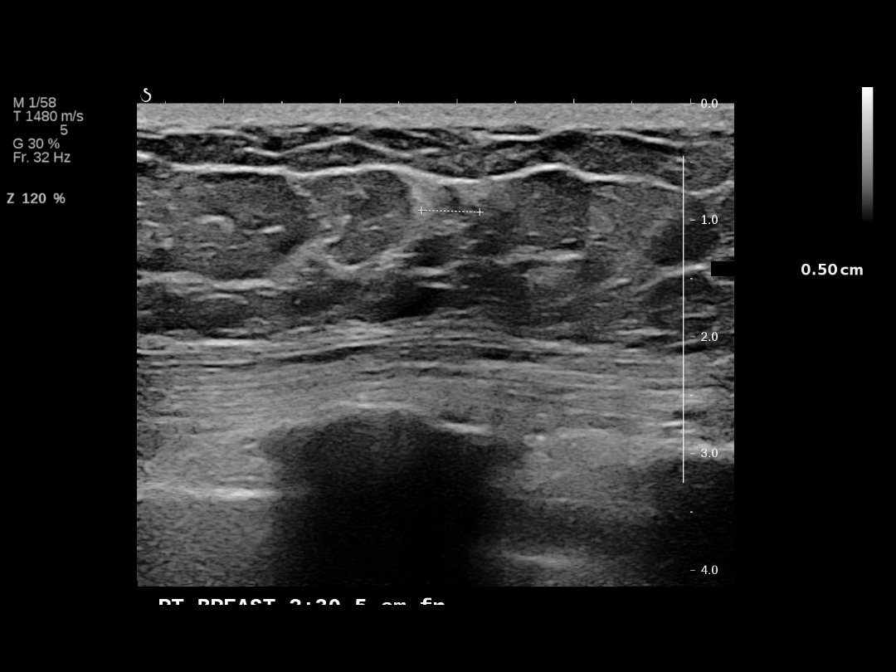
[im 8/8]
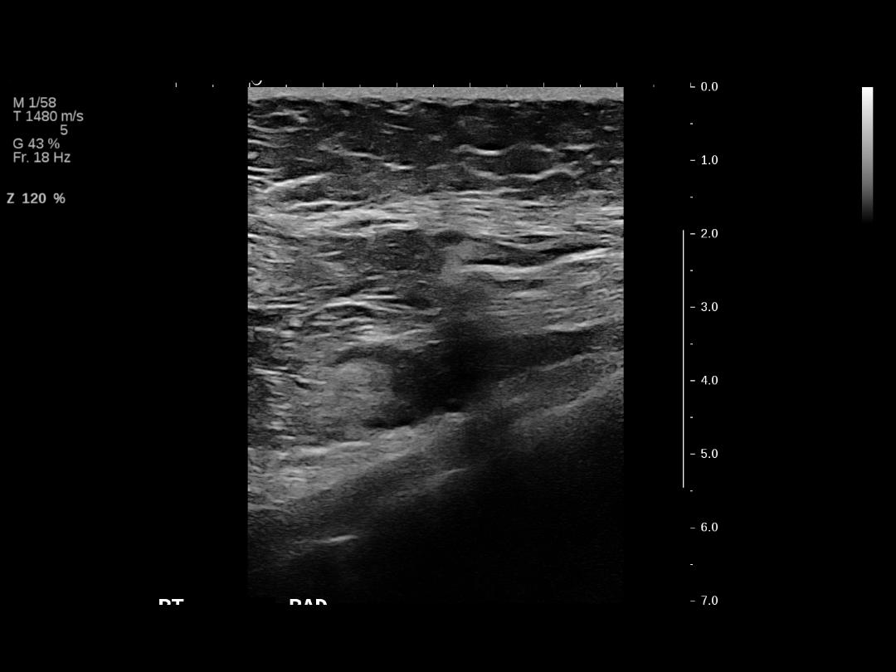

[8 of 8 positions shown; findings below may reference images not displayed]

ACR Breast Density Category b: There are scattered areas of
fibroglandular density.
FINDINGS: There is a small, irregular mass located medially within the right
breast at approximately the 2 to 3 o'clock position. There is a
central calcification present.

On physical exam, there is no discrete palpable abnormality within
the medial right breast.

Targeted ultrasound is performed, showing a subtle hypoechoic mass
with echogenic periphery located within the right breast at the 2:30
o'clock position approximately 5-6 cm from the nipple measuring 6 x
5 x 4 mm in size. Tissue sampling is recommended. As this is more
reliably visualized mammographically, I recommend stereotactic
biopsy of this mass with central calcification. This will be
scheduled.

Ultrasound the right axilla demonstrates normal axillary contents
and no evidence for adenopathy.
IMPRESSION: Small, irregular mass with central calcification located medially
within the right breast at approximately the 2 to 3 o'clock
position. Tissue sampling is recommended and stereotactic biopsy
will be scheduled.

RECOMMENDATION:
Right breast stereotactic biopsy.

I have discussed the findings and recommendations with the patient.
Results were also provided in writing at the conclusion of the
visit. If applicable, a reminder letter will be sent to the patient
regarding the next appointment.

BI-RADS CATEGORY  4: Suspicious.

## 2017-04-17 ENCOUNTER — Encounter (HOSPITAL_COMMUNITY): Payer: Self-pay | Admitting: Emergency Medicine

## 2017-04-17 ENCOUNTER — Emergency Department (HOSPITAL_COMMUNITY): Payer: Medicare Other

## 2017-04-17 ENCOUNTER — Emergency Department (HOSPITAL_COMMUNITY)
Admission: EM | Admit: 2017-04-17 | Discharge: 2017-04-17 | Disposition: A | Discharge status: Home / Self Care | Payer: Medicare Other | Attending: Emergency Medicine | Admitting: Emergency Medicine

## 2017-04-17 DIAGNOSIS — Z79899 Other long term (current) drug therapy: Secondary | ICD-10-CM | POA: Diagnosis not present

## 2017-04-17 DIAGNOSIS — Z85038 Personal history of other malignant neoplasm of large intestine: Secondary | ICD-10-CM | POA: Diagnosis not present

## 2017-04-17 DIAGNOSIS — R42 Dizziness and giddiness: Secondary | ICD-10-CM | POA: Diagnosis present

## 2017-04-17 DIAGNOSIS — R110 Nausea: Secondary | ICD-10-CM | POA: Diagnosis not present

## 2017-04-17 DIAGNOSIS — I10 Essential (primary) hypertension: Secondary | ICD-10-CM | POA: Diagnosis not present

## 2017-04-17 DIAGNOSIS — R079 Chest pain, unspecified: Secondary | ICD-10-CM | POA: Insufficient documentation

## 2017-04-17 LAB — CBC WITH DIFFERENTIAL/PLATELET
BASOS PCT: 1 %
Basophils Absolute: 0 10*3/uL (ref 0.0–0.1)
Eosinophils Absolute: 0.2 10*3/uL (ref 0.0–0.7)
Eosinophils Relative: 3 %
HEMATOCRIT: 42.5 % (ref 36.0–46.0)
HEMOGLOBIN: 14.7 g/dL (ref 12.0–15.0)
LYMPHS ABS: 2.1 10*3/uL (ref 0.7–4.0)
LYMPHS PCT: 33 %
MCH: 29.8 pg (ref 26.0–34.0)
MCHC: 34.6 g/dL (ref 30.0–36.0)
MCV: 86 fL (ref 78.0–100.0)
MONO ABS: 0.4 10*3/uL (ref 0.1–1.0)
MONOS PCT: 7 %
NEUTROS ABS: 3.7 10*3/uL (ref 1.7–7.7)
NEUTROS PCT: 58 %
Platelets: 177 10*3/uL (ref 150–400)
RBC: 4.94 MIL/uL (ref 3.87–5.11)
RDW: 12.7 % (ref 11.5–15.5)
WBC: 6.5 10*3/uL (ref 4.0–10.5)

## 2017-04-17 LAB — COMPREHENSIVE METABOLIC PANEL
ALBUMIN: 4 g/dL (ref 3.5–5.0)
ALK PHOS: 94 U/L (ref 38–126)
ALT: 50 U/L (ref 14–54)
ANION GAP: 9 (ref 5–15)
AST: 35 U/L (ref 15–41)
BILIRUBIN TOTAL: 0.9 mg/dL (ref 0.3–1.2)
BUN: 8 mg/dL (ref 6–20)
CALCIUM: 9 mg/dL (ref 8.9–10.3)
CO2: 22 mmol/L (ref 22–32)
Chloride: 106 mmol/L (ref 101–111)
Creatinine, Ser: 0.82 mg/dL (ref 0.44–1.00)
GFR calc Af Amer: >60 mL/min (ref >60)
Glucose, Bld: 125 mg/dL — ABNORMAL HIGH (ref 65–99)
Potassium: 3.8 mmol/L (ref 3.5–5.1)
Sodium: 137 mmol/L (ref 135–145)
TOTAL PROTEIN: 6.8 g/dL (ref 6.5–8.1)

## 2017-04-17 LAB — I-STAT CG4 LACTIC ACID, ED: Lactic Acid, Venous: 1.58 mmol/L (ref 0.5–1.9)

## 2017-04-17 LAB — I-STAT TROPONIN, ED: Troponin i, poc: 0 ng/mL (ref 0.00–0.08)

## 2017-04-17 NOTE — ED Triage Notes (Addendum)
Pt states she woke up feeling normal. PT noted around 10am began feeling dizzy with high blood pressure. PT also stated she had centralized chest pressure radiating into the right shoulder blade. Pain to chest 0/10, shoulder blade is 3/10. Pt hypertensive at triage. 202/92.  Pt does report lower abdominal pain after she eats too. Along with not feeling fell for 1 week. Pt states hx of colon cancer. Decreased appetite as well.

## 2017-04-17 NOTE — ED Notes (Signed)
Pt tolerating water.  

## 2017-04-17 NOTE — ED Provider Notes (Signed)
[name] Provider Note   CSN: [id number] Arrival date & time: 04/17/17  1159     History   Chief Complaint Chief Complaint  Patient presents with  . Chest Pain  . Dizziness    HPI Donna Buck is a 77 y.o. female.  HPI Patient is a 77 year old female presents the emergency department with complaints of dizziness and high blood pressure.  Denies headache.  She also developed some mild chest discomfort.  She was hypertensive on arrival to triage at 202/92.  She states compliance with her medications and denies significant dietary indiscretion.  Patient reports some loose stools and mild crampy abdominal pain with eating over the past week without nausea or vomiting.  No fevers or chills.    Past Medical History:  Diagnosis Date  . Arthritis   . Cancer ([place])    colon  . Fatty liver    mild  . GERD (gastroesophageal reflux disease)   . Headache   . Hyperlipidemia   . Hypertension   . Macular degeneration   . Pneumonia     Patient Active Problem List   Diagnosis Date Noted  . Chest pain 09/05/2014  . HTN (hypertension) 06/14/2014    Past Surgical History:  Procedure Laterality Date  . APPENDECTOMY    . BREAST SURGERY     right breast biopsy  . CARDIAC CATHETERIZATION N/A 10/03/2014   Procedure: Left Heart Cath and Coronary Angiography;  Surgeon: [name], MD;  Location: [facility];  Service: Cardiovascular;  Laterality: N/A;  . CATARACT EXTRACTION W/ INTRAOCULAR LENS  IMPLANT, BILATERAL    . CHOLECYSTECTOMY    . COLON SURGERY    . COLONOSCOPY WITH PROPOFOL N/A 05/05/2015   Procedure: COLONOSCOPY WITH PROPOFOL;  Surgeon: [name], MD;  Location: [facility] END[facility]COPY;  Service: Endoscopy;  Laterality: N/A;  ultra slim scope  . ESOPHAGOGASTRODUODENOSCOPY (EGD) WITH PROPOFOL N/A 05/05/2015   Procedure: ESOPHAGOGASTRODUODENOSCOPY (EGD) WITH PROPOFOL;  Surgeon: [name], MD;  Location: [facility] ENDOSCOPY;  Service: Endoscopy;   Laterality: N/A;    OB History    No data available       Home Medications    Prior to Admission medications   Medication Sig Start Date End Date Taking? Authorizing Provider  ALPRAZolam (XANAX XR) 0.5 MG 24 hr tablet Take 0.5 mg by mouth daily as needed for anxiety.    Yes [provider]  carvedilol (COREG) 25 MG tablet Take 25 mg by mouth 2 (two) times daily with a meal.   Yes [provider]  cloNIDine (CATAPRES) 0.1 MG tablet Take 0.1 mg by mouth 2 (two) times daily.   Yes [provider]  Multiple Vitamins-Minerals (MULTIVITAMIN WITH MINERALS) tablet Take 1 tablet by mouth daily.   Yes [provider]  traMADol (ULTRAM) 50 MG tablet Take 50 mg by mouth every 6 (six) hours as needed for moderate pain.   Yes [provider]    Family History Family History  Problem Relation Age of Onset  . Hypertension Mother   . Heart attack Father   . Heart failure Sister   . Heart failure Brother   . Diabetes Maternal Aunt     Social History Social History   Tobacco Use  . Smoking status: Never Smoker  . Smokeless tobacco: Never Used  Substance Use Topics  . Alcohol use: No    Alcohol/week: 0.0 oz  . Drug use: No     Allergies   Penicillins  and Isovue [iopamidol]   Review of Systems Review of Systems  All other systems reviewed and are negative.    Physical Exam Updated Vital Signs BP (!) 175/77   Pulse (!) 58   Temp 97.8 F (36.6 C) (Oral)   Resp 16   SpO2 98%   Physical Exam  Constitutional: She is oriented to person, place, and time. She appears well-developed and well-nourished.  HENT:  Head: Normocephalic.  Eyes: EOM are normal.  Neck: Normal range of motion.  Cardiovascular: Normal rate and regular rhythm.  Pulmonary/Chest: Effort normal and breath sounds normal.  Abdominal: Soft. She exhibits no distension. There is no tenderness.  Musculoskeletal: Normal range of motion.  Neurological: She is alert and  oriented to person, place, and time.  Psychiatric: She has a normal mood and affect.  Nursing note and vitals reviewed.    ED Treatments / Results  Labs (all labs ordered are listed, but only abnormal results are displayed) Labs Reviewed  COMPREHENSIVE METABOLIC PANEL - Abnormal; Notable for the following components:      Result Value   Glucose, Bld 125 (*)    All other components within normal limits  CBC WITH DIFFERENTIAL/PLATELET  I-STAT TROPONIN, ED  I-STAT CG4 LACTIC ACID, ED    EKG  EKG Interpretation  Date/Time:  Sunday April 17 2017 12:05:39 EST Ventricular Rate:  65 PR Interval:  182 QRS Duration: 102 QT Interval:  434 QTC Calculation: 451 R Axis:   -12 Text Interpretation:  Normal sinus rhythm Possible Anterolateral infarct , age undetermined Abnormal ECG No significant change was found Confirmed by Jola Schmidt [phone]) on 04/17/2017 12:15:07 PM       Radiology Dg Chest 2 View  Result Date: 04/17/2017 CLINICAL DATA:  Chest pain EXAM: CHEST  2 VIEW COMPARISON:  11/27/2015 FINDINGS: Heart is borderline in size. Mild hyperinflation of the lungs. Lingular scarring. No acute confluent airspace opacities or effusions. No acute bony abnormality. IMPRESSION: Hyperinflation.  Borderline cardiomegaly.  No active disease. Electronically Signed   By: Rolm Baptise M.D.   On: 04/17/2017 13:34   Dg Abd 2 Views  Result Date: 04/17/2017 CLINICAL DATA:  Dizziness today.  Chest pressure. EXAM: ABDOMEN - 2 VIEW COMPARISON:  10/15/2014 FINDINGS: There are no disproportionally dilated loops of bowel. There is no free intraperitoneal gas. Round calcifications in the pelvis are likely phleboliths. Levoscoliosis in the lumbar spine. IMPRESSION: Nonobstructive bowel gas pattern. Electronically Signed   By: Marybelle Killings M.D.   On: 04/17/2017 13:35    Procedures Procedures (including critical care time)  Medications Ordered in ED Medications - No data to display   Initial  Impression / Assessment and Plan / ED Course  I have reviewed the triage vital signs and the nursing notes.  Pertinent labs & imaging results that were available during my care of the patient were reviewed by me and considered in my medical decision making (see chart for details).     Patient is overall well-appearing.  Blood pressure improving without any specific treatment here in the ER.  Nonspecific chest pain.  No signs to suggest ACS.  EKG without ischemic changes.  Troponin negative.  Patient is overall well-appearing she is safe for discharge from the emergency department with close primary care follow-up  Final Clinical Impressions(s) / ED Diagnoses   Final diagnoses:  Nausea  Chest pain, unspecified type    ED Discharge Orders    None       Jola Schmidt, MD 04/17/17 1547

## 2017-08-19 ENCOUNTER — Other Ambulatory Visit: Payer: Self-pay | Admitting: Internal Medicine

## 2017-08-22 ENCOUNTER — Other Ambulatory Visit: Payer: Self-pay | Admitting: Internal Medicine

## 2017-08-22 DIAGNOSIS — R945 Abnormal results of liver function studies: Principal | ICD-10-CM

## 2017-08-22 DIAGNOSIS — R7989 Other specified abnormal findings of blood chemistry: Secondary | ICD-10-CM

## 2017-08-31 ENCOUNTER — Ambulatory Visit
Admission: RE | Admit: 2017-08-31 | Discharge: 2017-08-31 | Disposition: A | Discharge status: Home / Self Care | Payer: Medicare Other | Source: Ambulatory Visit | Attending: Internal Medicine | Admitting: Internal Medicine

## 2017-08-31 DIAGNOSIS — R7989 Other specified abnormal findings of blood chemistry: Secondary | ICD-10-CM

## 2017-08-31 DIAGNOSIS — R945 Abnormal results of liver function studies: Principal | ICD-10-CM

## 2017-08-31 MED ORDER — IOPAMIDOL (ISOVUE-300) INJECTION 61%
100.0000 mL | Freq: Once | INTRAVENOUS | Status: AC | PRN
  Administered 2017-08-31: 100 mL via INTRAVENOUS

## 2017-09-06 ENCOUNTER — Other Ambulatory Visit: Payer: Self-pay | Admitting: Internal Medicine

## 2017-09-06 DIAGNOSIS — Z1231 Encounter for screening mammogram for malignant neoplasm of breast: Secondary | ICD-10-CM

## 2017-09-28 ENCOUNTER — Ambulatory Visit
Admission: RE | Admit: 2017-09-28 | Discharge: 2017-09-28 | Disposition: A | Discharge status: Home / Self Care | Payer: Medicare Other | Source: Ambulatory Visit | Attending: Internal Medicine | Admitting: Internal Medicine

## 2017-09-28 DIAGNOSIS — Z1231 Encounter for screening mammogram for malignant neoplasm of breast: Secondary | ICD-10-CM

## 2018-09-01 ENCOUNTER — Other Ambulatory Visit: Payer: Self-pay | Admitting: Internal Medicine

## 2018-09-01 DIAGNOSIS — R1310 Dysphagia, unspecified: Secondary | ICD-10-CM

## 2018-12-18 ENCOUNTER — Ambulatory Visit: Payer: Medicare Other

## 2019-01-17 ENCOUNTER — Other Ambulatory Visit: Payer: Self-pay | Admitting: Internal Medicine

## 2019-01-17 DIAGNOSIS — Z1231 Encounter for screening mammogram for malignant neoplasm of breast: Secondary | ICD-10-CM

## 2019-01-17 DIAGNOSIS — M8580 Other specified disorders of bone density and structure, unspecified site: Secondary | ICD-10-CM

## 2019-02-06 ENCOUNTER — Other Ambulatory Visit: Payer: Medicare Other

## 2019-03-06 ENCOUNTER — Other Ambulatory Visit: Payer: Self-pay

## 2019-03-06 ENCOUNTER — Ambulatory Visit
Admission: RE | Admit: 2019-03-06 | Discharge: 2019-03-06 | Disposition: A | Discharge status: Home / Self Care | Payer: Medicare Other | Source: Ambulatory Visit | Attending: Internal Medicine | Admitting: Internal Medicine

## 2019-03-06 DIAGNOSIS — Z1231 Encounter for screening mammogram for malignant neoplasm of breast: Secondary | ICD-10-CM

## 2021-07-03 ENCOUNTER — Other Ambulatory Visit: Payer: Self-pay | Admitting: Physician Assistant

## 2021-07-03 DIAGNOSIS — N6320 Unspecified lump in the left breast, unspecified quadrant: Secondary | ICD-10-CM
